# Patient Record
Sex: Female | Born: 1975 | Race: White | Hispanic: No | Marital: Single | Smoking: Current every day smoker
Health system: Southern US, Community
[De-identification: ages and names within clinical notes are randomized; demographics above are authoritative.]

## PROBLEM LIST (undated history)

## (undated) DIAGNOSIS — K8020 Calculus of gallbladder without cholecystitis without obstruction: Secondary | ICD-10-CM

## (undated) DIAGNOSIS — G4700 Insomnia, unspecified: Secondary | ICD-10-CM

## (undated) DIAGNOSIS — M5412 Radiculopathy, cervical region: Secondary | ICD-10-CM

## (undated) DIAGNOSIS — F32A Depression, unspecified: Secondary | ICD-10-CM

## (undated) DIAGNOSIS — M5136 Other intervertebral disc degeneration, lumbar region: Secondary | ICD-10-CM

## (undated) DIAGNOSIS — F329 Major depressive disorder, single episode, unspecified: Secondary | ICD-10-CM

## (undated) DIAGNOSIS — C539 Malignant neoplasm of cervix uteri, unspecified: Secondary | ICD-10-CM

## (undated) DIAGNOSIS — K219 Gastro-esophageal reflux disease without esophagitis: Secondary | ICD-10-CM

## (undated) DIAGNOSIS — O24419 Gestational diabetes mellitus in pregnancy, unspecified control: Secondary | ICD-10-CM

## (undated) HISTORY — PX: ANKLE SURGERY: SHX546

## (undated) HISTORY — PX: CERVICAL CONE BIOPSY: SUR198

---
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
## 2008-04-12 ENCOUNTER — Emergency Department (HOSPITAL_BASED_OUTPATIENT_CLINIC_OR_DEPARTMENT_OTHER): Admission: EM | Admit: 2008-04-12 | Discharge: 2008-04-12 | Payer: Self-pay | Admitting: Emergency Medicine

## 2008-08-01 ENCOUNTER — Emergency Department (HOSPITAL_COMMUNITY): Admission: EM | Admit: 2008-08-01 | Discharge: 2008-08-01 | Payer: Self-pay | Admitting: Emergency Medicine

## 2008-09-15 ENCOUNTER — Emergency Department (HOSPITAL_BASED_OUTPATIENT_CLINIC_OR_DEPARTMENT_OTHER): Admission: EM | Admit: 2008-09-15 | Discharge: 2008-09-15 | Payer: Self-pay | Admitting: Emergency Medicine

## 2009-07-29 ENCOUNTER — Emergency Department (HOSPITAL_BASED_OUTPATIENT_CLINIC_OR_DEPARTMENT_OTHER): Admission: EM | Admit: 2009-07-29 | Discharge: 2009-07-29 | Payer: Self-pay | Admitting: Emergency Medicine

## 2009-07-29 ENCOUNTER — Ambulatory Visit: Payer: Self-pay | Admitting: Diagnostic Radiology

## 2010-10-24 NOTE — L&D Delivery Note (Signed)
Delivery Note At 4:39 PM a viable female was delivered via Vaginal, Spontaneous Delivery (Presentation: ; Occiput Anterior).  APGAR: 9, 9; weight 6 lb 7.7 oz (2940 g).   Placenta status: Intact, Spontaneous.  Cord: 3 vessels with the following complications: None.   Anesthesia: Epidural  Episiotomy: None Lacerations: Perineal;Labial;2nd degree Suture Repair: 3.0 vicryl rapide Est. Blood Loss (mL): 400  Mom to postpartum.  Baby to nursery-stable.  [name],Deborah Therien 05/24/2011, 5:25 PM

## 2011-01-27 LAB — DIFFERENTIAL
Basophils Absolute: 0.1 10*3/uL (ref 0.0–0.1)
Basophils Relative: 1 % (ref 0–1)
Eosinophils Relative: 1 % (ref 0–5)
Lymphocytes Relative: 21 % (ref 12–46)
Neutro Abs: 7.8 10*3/uL — ABNORMAL HIGH (ref 1.7–7.7)
Neutrophils Relative %: 73 % (ref 43–77)

## 2011-01-27 LAB — COMPREHENSIVE METABOLIC PANEL
ALT: 18 U/L (ref 0–35)
Alkaline Phosphatase: 88 U/L (ref 39–117)
BUN: 9 mg/dL (ref 6–23)
Creatinine, Ser: 0.7 mg/dL (ref 0.4–1.2)
GFR calc Af Amer: >60 mL/min (ref >60)
GFR calc non Af Amer: >60 mL/min (ref >60)
Sodium: 141 mEq/L (ref 135–145)
Total Bilirubin: 0.7 mg/dL (ref 0.3–1.2)
Total Protein: 8 g/dL (ref 6.0–8.3)

## 2011-01-27 LAB — CBC
Hemoglobin: 15 g/dL (ref 12.0–15.0)
MCHC: 33 g/dL (ref 30.0–36.0)
MCV: 92.6 fL (ref 78.0–100.0)

## 2011-01-27 LAB — URINALYSIS, ROUTINE W REFLEX MICROSCOPIC
Bilirubin Urine: NEGATIVE
Ketones, ur: NEGATIVE mg/dL
Protein, ur: NEGATIVE mg/dL
Specific Gravity, Urine: 1.02 (ref 1.005–1.030)

## 2011-01-27 LAB — PREGNANCY, URINE: Preg Test, Ur: NEGATIVE

## 2011-02-04 ENCOUNTER — Inpatient Hospital Stay (HOSPITAL_COMMUNITY): Payer: Medicaid Other

## 2011-02-04 ENCOUNTER — Inpatient Hospital Stay (HOSPITAL_COMMUNITY)
Admission: AD | Admit: 2011-02-04 | Discharge: 2011-02-04 | Disposition: A | Discharge status: Home / Self Care | Payer: Medicaid Other | Source: Ambulatory Visit | Attending: Obstetrics and Gynecology | Admitting: Obstetrics and Gynecology

## 2011-02-04 DIAGNOSIS — O212 Late vomiting of pregnancy: Secondary | ICD-10-CM

## 2011-02-04 LAB — URINALYSIS, ROUTINE W REFLEX MICROSCOPIC
Bilirubin Urine: NEGATIVE
Nitrite: NEGATIVE
Protein, ur: NEGATIVE mg/dL
Specific Gravity, Urine: 1.02 (ref 1.005–1.030)
Urobilinogen, UA: 0.2 mg/dL (ref 0.0–1.0)
pH: >9 — ABNORMAL HIGH (ref 5.0–8.0)

## 2011-02-04 LAB — COMPREHENSIVE METABOLIC PANEL
AST: 16 U/L (ref 0–37)
Albumin: 3 g/dL — ABNORMAL LOW (ref 3.5–5.2)
Chloride: 104 mEq/L (ref 96–112)
Creatinine, Ser: 0.6 mg/dL (ref 0.4–1.2)
Total Protein: 6.4 g/dL (ref 6.0–8.3)

## 2011-02-04 LAB — AMYLASE: Amylase: 56 U/L (ref 0–105)

## 2011-02-04 LAB — CBC
MCHC: 32.9 g/dL (ref 30.0–36.0)
Platelets: 204 10*3/uL (ref 150–400)
RBC: 4.04 MIL/uL (ref 3.87–5.11)
RDW: 13.7 % (ref 11.5–15.5)
WBC: 16.9 10*3/uL — ABNORMAL HIGH (ref 4.0–10.5)

## 2011-05-18 ENCOUNTER — Other Ambulatory Visit: Payer: Self-pay | Admitting: Obstetrics and Gynecology

## 2011-05-23 ENCOUNTER — Encounter (HOSPITAL_COMMUNITY): Payer: Self-pay

## 2011-05-23 ENCOUNTER — Other Ambulatory Visit: Payer: Self-pay | Admitting: Obstetrics and Gynecology

## 2011-05-23 ENCOUNTER — Inpatient Hospital Stay (HOSPITAL_COMMUNITY)
Admission: RE | Admit: 2011-05-23 | Discharge: 2011-05-26 | DRG: 775 | Disposition: A | Discharge status: Home / Self Care | Payer: Medicaid Other | Source: Ambulatory Visit | Attending: Obstetrics and Gynecology | Admitting: Obstetrics and Gynecology

## 2011-05-23 DIAGNOSIS — Z3490 Encounter for supervision of normal pregnancy, unspecified, unspecified trimester: Secondary | ICD-10-CM

## 2011-05-23 DIAGNOSIS — O99814 Abnormal glucose complicating childbirth: Principal | ICD-10-CM | POA: Diagnosis present

## 2011-05-23 DIAGNOSIS — O24419 Gestational diabetes mellitus in pregnancy, unspecified control: Secondary | ICD-10-CM | POA: Diagnosis present

## 2011-05-23 HISTORY — DX: Major depressive disorder, single episode, unspecified: F32.9

## 2011-05-23 HISTORY — DX: Radiculopathy, cervical region: M54.12

## 2011-05-23 HISTORY — DX: Gastro-esophageal reflux disease without esophagitis: K21.9

## 2011-05-23 HISTORY — DX: Calculus of gallbladder without cholecystitis without obstruction: K80.20

## 2011-05-23 HISTORY — DX: Depression, unspecified: F32.A

## 2011-05-23 HISTORY — DX: Gestational diabetes mellitus in pregnancy, unspecified control: O24.419

## 2011-05-23 LAB — RPR: RPR: NONREACTIVE

## 2011-05-23 LAB — CBC
Hemoglobin: 11.9 g/dL — ABNORMAL LOW (ref 12.0–15.0)
MCH: 30.2 pg (ref 26.0–34.0)
MCHC: 33 g/dL (ref 30.0–36.0)
MCV: 91.6 fL (ref 78.0–100.0)

## 2011-05-23 LAB — STREP B DNA PROBE: GBS: POSITIVE

## 2011-05-23 LAB — HEPATITIS B SURFACE ANTIGEN: Hepatitis B Surface Ag: NEGATIVE

## 2011-05-23 MED ORDER — CITRIC ACID-SODIUM CITRATE 334-500 MG/5ML PO SOLN: 30.0000 mL | ORAL | Status: DC | PRN

## 2011-05-23 MED ORDER — OXYTOCIN 20 UNITS IN LACTATED RINGERS INFUSION - SIMPLE
125.0000 mL/h | Freq: Once | INTRAVENOUS | Status: AC
  Administered 2011-05-24: 500 mL/h via INTRAVENOUS

## 2011-05-23 MED ORDER — ZOLPIDEM TARTRATE 10 MG PO TABS
10.0000 mg | ORAL_TABLET | Freq: Every evening | ORAL | Status: DC | PRN
  Administered 2011-05-23: 10 mg via ORAL
  Filled 2011-05-23: qty 1

## 2011-05-23 MED ORDER — LACTATED RINGERS IV SOLN
INTRAVENOUS | Status: DC
  Administered 2011-05-24 (×2): via INTRAVENOUS

## 2011-05-23 MED ORDER — SODIUM CHLORIDE 0.9 % IJ SOLN
3.0000 mL | Freq: Two times a day (BID) | INTRAMUSCULAR | Status: AC
  Administered 2011-05-23 – 2011-05-24 (×3): 3 mL via INTRAVENOUS

## 2011-05-23 MED ORDER — OXYCODONE-ACETAMINOPHEN 5-325 MG PO TABS: 2.0000 | ORAL_TABLET | ORAL | Status: DC | PRN

## 2011-05-23 MED ORDER — ONDANSETRON HCL 4 MG/2ML IJ SOLN
4.0000 mg | Freq: Four times a day (QID) | INTRAMUSCULAR | Status: DC | PRN
  Administered 2011-05-24 (×2): 4 mg via INTRAVENOUS
  Filled 2011-05-23 (×2): qty 2

## 2011-05-23 MED ORDER — NALBUPHINE SYRINGE 5 MG/0.5 ML
10.0000 mg | INJECTION | INTRAMUSCULAR | Status: DC | PRN
  Administered 2011-05-24: 10 mg via INTRAVENOUS
  Filled 2011-05-23 (×2): qty 1

## 2011-05-23 MED ORDER — TERBUTALINE SULFATE 1 MG/ML IJ SOLN: 0.2500 mg | Freq: Once | INTRAMUSCULAR | Status: AC | PRN

## 2011-05-23 MED ORDER — LIDOCAINE HCL (PF) 1 % IJ SOLN
30.0000 mL | INTRAMUSCULAR | Status: DC | PRN
  Filled 2011-05-23: qty 30

## 2011-05-23 MED ORDER — ACETAMINOPHEN 325 MG PO TABS: 650.0000 mg | ORAL_TABLET | ORAL | Status: DC | PRN

## 2011-05-23 MED ORDER — TERBUTALINE SULFATE 1 MG/ML IJ SOLN: 0.2500 mg | Freq: Once | INTRAMUSCULAR | Status: DC | PRN

## 2011-05-23 MED ORDER — LACTATED RINGERS IV SOLN: 500.0000 mL | INTRAVENOUS | Status: DC | PRN

## 2011-05-23 MED ORDER — FLEET ENEMA 7-19 GM/118ML RE ENEM: 1.0000 | ENEMA | RECTAL | Status: DC | PRN

## 2011-05-23 MED ORDER — MISOPROSTOL 25 MCG QUARTER TABLET
25.0000 ug | ORAL_TABLET | ORAL | Status: DC | PRN
  Administered 2011-05-23 – 2011-05-24 (×2): 25 ug via VAGINAL
  Filled 2011-05-23 (×2): qty 0.25
  Filled 2011-05-23: qty 1

## 2011-05-23 NOTE — H&P (Signed)
Deborah [name] is an 35 y.o. female. G2P1001 at 39+ for iol secondary to GDM.  +FM, no LOF, no VB, occ ctx; Pregnancy complicated by DM, ? Pre-existing DM.  PMH - Cervical radiculopathy Depression Carpal Tunnel Syndrome  PSH: LEEP/Cone Ankle surgery Jaw surgery   Pertinent Gynecological History:  Sexually transmitted diseases: no past history Previous GYN Procedures: cone  pap:11/2010 WNL OB History: G2, P1001, TSVD 6#13    Family History CVA, DM.  Social History: denies alcohol, tobacco, or drug use; single  Allergies: NKDA/ No Latex   Review of Systems  Constitutional: Negative.   HENT: Negative.   Eyes: Negative.   Respiratory: Negative.   Cardiovascular: Negative.   Gastrointestinal: Negative.   Genitourinary: Negative.   Musculoskeletal: Positive for back pain.  Skin: Negative.   Neurological: Negative.   Endo/Heme/Allergies: Negative.   Psychiatric/Behavioral: Negative.    AFVSS Physical Exam  Constitutional: She is oriented to person, place, and time. She appears well-developed and well-nourished.  HENT:  Head: Normocephalic and atraumatic.  Eyes: Pupils are equal, round, and reactive to light.  Neck: Normal range of motion.  Cardiovascular: Normal rate and regular rhythm.   Respiratory: Effort normal and breath sounds normal.  GI: Soft.       Fundus NT  Neurological: She is alert and oriented to person, place, and time.    PNL: A-, Ab Scr neg, Hgb 12.9, Pap WNL, RI, HepBsAg -, HIV -, Plt 247K, GC neg, Chl neg, First Tri screen WNL, Rhogam 6/1, GBBS + [place] nl anat, ant plac, good growth, f/u with BW NST  Assessment/Plan: 35yo G2P1001 at 39+ with DM/GDM for iol Unfavorable cervix - treat with cervidil GBBS+ treat with PCN when pitocin started Expect SVD Follow FSBS  BOVARD,Lane Kjos 05/23/2011, 5:25 PM

## 2011-05-24 ENCOUNTER — Ambulatory Visit (HOSPITAL_COMMUNITY): Payer: Medicaid Other | Admitting: Anesthesiology

## 2011-05-24 ENCOUNTER — Encounter (HOSPITAL_COMMUNITY): Payer: Self-pay | Admitting: Anesthesiology

## 2011-05-24 ENCOUNTER — Encounter (HOSPITAL_COMMUNITY): Payer: Self-pay

## 2011-05-24 DIAGNOSIS — O24419 Gestational diabetes mellitus in pregnancy, unspecified control: Secondary | ICD-10-CM | POA: Diagnosis present

## 2011-05-24 DIAGNOSIS — Z3490 Encounter for supervision of normal pregnancy, unspecified, unspecified trimester: Secondary | ICD-10-CM

## 2011-05-24 LAB — RPR: RPR Ser Ql: NONREACTIVE

## 2011-05-24 LAB — GLUCOSE, CAPILLARY
Glucose-Capillary: 119 mg/dL — ABNORMAL HIGH (ref 70–99)
Glucose-Capillary: 120 mg/dL — ABNORMAL HIGH (ref 70–99)

## 2011-05-24 LAB — ABO/RH: ABO/RH(D): A NEG

## 2011-05-24 MED ORDER — BENZOCAINE-MENTHOL 20-0.5 % EX AERO
INHALATION_SPRAY | CUTANEOUS | Status: AC
  Filled 2011-05-24: qty 56

## 2011-05-24 MED ORDER — LACTATED RINGERS IV SOLN: INTRAVENOUS | Status: DC

## 2011-05-24 MED ORDER — PRENATAL PLUS 27-1 MG PO TABS
1.0000 | ORAL_TABLET | Freq: Every day | ORAL | Status: DC
  Administered 2011-05-24 – 2011-05-25 (×2): 1 via ORAL
  Filled 2011-05-24 (×2): qty 1

## 2011-05-24 MED ORDER — EPHEDRINE 5 MG/ML INJ
10.0000 mg | INTRAVENOUS | Status: DC | PRN
  Administered 2011-05-24: 10 mg via INTRAVENOUS
  Filled 2011-05-24: qty 4

## 2011-05-24 MED ORDER — LANOLIN HYDROUS EX OINT: TOPICAL_OINTMENT | CUTANEOUS | Status: DC | PRN

## 2011-05-24 MED ORDER — SODIUM CHLORIDE 0.9 % IV SOLN: INTRAVENOUS | Status: DC

## 2011-05-24 MED ORDER — SIMETHICONE 80 MG PO CHEW: 80.0000 mg | CHEWABLE_TABLET | ORAL | Status: DC | PRN

## 2011-05-24 MED ORDER — SODIUM CHLORIDE 0.45 % IV SOLN: INTRAVENOUS | Status: DC

## 2011-05-24 MED ORDER — COMPLETENATE 29-1 MG PO CHEW
1.0000 | CHEWABLE_TABLET | Freq: Every day | ORAL | Status: DC
  Administered 2011-05-26: 1 via ORAL
  Filled 2011-05-24 (×3): qty 1

## 2011-05-24 MED ORDER — DEXTROSE-NACL 5-0.45 % IV SOLN: INTRAVENOUS | Status: DC

## 2011-05-24 MED ORDER — ONDANSETRON HCL 4 MG/2ML IJ SOLN: 4.0000 mg | INTRAMUSCULAR | Status: DC | PRN

## 2011-05-24 MED ORDER — IBUPROFEN 600 MG PO TABS: 600.0000 mg | ORAL_TABLET | Freq: Four times a day (QID) | ORAL | Status: DC | PRN

## 2011-05-24 MED ORDER — ONDANSETRON HCL 4 MG PO TABS: 4.0000 mg | ORAL_TABLET | ORAL | Status: DC | PRN

## 2011-05-24 MED ORDER — RANITIDINE HCL 150 MG PO TABS: 150.0000 mg | ORAL_TABLET | Freq: Two times a day (BID) | ORAL | Status: DC

## 2011-05-24 MED ORDER — PHENYLEPHRINE 40 MCG/ML (10ML) SYRINGE FOR IV PUSH (FOR BLOOD PRESSURE SUPPORT)
80.0000 ug | PREFILLED_SYRINGE | INTRAVENOUS | Status: DC | PRN
  Filled 2011-05-24 (×2): qty 5

## 2011-05-24 MED ORDER — WITCH HAZEL-GLYCERIN EX PADS: MEDICATED_PAD | CUTANEOUS | Status: DC | PRN

## 2011-05-24 MED ORDER — PENICILLIN G POTASSIUM 5000000 UNITS IJ SOLR
5.0000 10*6.[IU] | Freq: Once | INTRAVENOUS | Status: AC
  Administered 2011-05-24: 5 10*6.[IU] via INTRAVENOUS
  Filled 2011-05-24: qty 5

## 2011-05-24 MED ORDER — PENICILLIN G POTASSIUM 5000000 UNITS IJ SOLR
2.5000 10*6.[IU] | INTRAVENOUS | Status: DC
  Administered 2011-05-24 (×2): 2.5 10*6.[IU] via INTRAVENOUS
  Filled 2011-05-24 (×5): qty 2.5

## 2011-05-24 MED ORDER — OXYTOCIN 20 UNITS IN LACTATED RINGERS INFUSION - SIMPLE
1.0000 m[IU]/min | INTRAVENOUS | Status: DC
  Administered 2011-05-24: 2 m[IU]/min via INTRAVENOUS
  Filled 2011-05-24: qty 1000

## 2011-05-24 MED ORDER — SENNOSIDES-DOCUSATE SODIUM 8.6-50 MG PO TABS
1.0000 | ORAL_TABLET | Freq: Every day | ORAL | Status: DC
  Administered 2011-05-24 – 2011-05-25 (×2): 1 via ORAL

## 2011-05-24 MED ORDER — ZOLPIDEM TARTRATE 5 MG PO TABS: 5.0000 mg | ORAL_TABLET | Freq: Every evening | ORAL | Status: DC | PRN

## 2011-05-24 MED ORDER — PHENYLEPHRINE 40 MCG/ML (10ML) SYRINGE FOR IV PUSH (FOR BLOOD PRESSURE SUPPORT)
80.0000 ug | PREFILLED_SYRINGE | INTRAVENOUS | Status: DC | PRN
  Filled 2011-05-24: qty 5

## 2011-05-24 MED ORDER — DEXTROSE 50 % IV SOLN: 25.0000 mL | INTRAVENOUS | Status: DC | PRN

## 2011-05-24 MED ORDER — TERBUTALINE SULFATE 1 MG/ML IJ SOLN: 0.2500 mg | Freq: Once | INTRAMUSCULAR | Status: DC | PRN

## 2011-05-24 MED ORDER — IBUPROFEN 600 MG PO TABS
600.0000 mg | ORAL_TABLET | Freq: Four times a day (QID) | ORAL | Status: DC
  Administered 2011-05-25 – 2011-05-26 (×6): 600 mg via ORAL
  Filled 2011-05-24 (×6): qty 1

## 2011-05-24 MED ORDER — ONDANSETRON HCL 4 MG PO TABS: 4.0000 mg | ORAL_TABLET | Freq: Three times a day (TID) | ORAL | Status: DC | PRN

## 2011-05-24 MED ORDER — FAMOTIDINE 20 MG PO TABS
20.0000 mg | ORAL_TABLET | Freq: Two times a day (BID) | ORAL | Status: DC
  Administered 2011-05-24 – 2011-05-26 (×5): 20 mg via ORAL
  Filled 2011-05-24 (×5): qty 1

## 2011-05-24 MED ORDER — LACTATED RINGERS IV SOLN
500.0000 mL | Freq: Once | INTRAVENOUS | Status: AC
  Administered 2011-05-24: 500 mL via INTRAVENOUS

## 2011-05-24 MED ORDER — DIPHENHYDRAMINE HCL 50 MG/ML IJ SOLN: 12.5000 mg | INTRAMUSCULAR | Status: DC | PRN

## 2011-05-24 MED ORDER — EPHEDRINE 5 MG/ML INJ
10.0000 mg | INTRAVENOUS | Status: DC | PRN
  Administered 2011-05-24: 10 mg via INTRAVENOUS
  Filled 2011-05-24 (×2): qty 4

## 2011-05-24 MED ORDER — BENZOCAINE-MENTHOL 20-0.5 % EX AERO: 1.0000 "application " | INHALATION_SPRAY | CUTANEOUS | Status: DC | PRN

## 2011-05-24 MED ORDER — FENTANYL 2.5 MCG/ML BUPIVACAINE 1/10 % EPIDURAL INFUSION (WH - ANES)
14.0000 mL/h | INTRAMUSCULAR | Status: DC
  Administered 2011-05-24 (×3): 14 mL/h via EPIDURAL
  Filled 2011-05-24 (×2): qty 60

## 2011-05-24 MED ORDER — TETANUS-DIPHTH-ACELL PERTUSSIS 5-2.5-18.5 LF-MCG/0.5 IM SUSP
0.5000 mL | Freq: Once | INTRAMUSCULAR | Status: AC
  Administered 2011-05-25: 0.5 mL via INTRAMUSCULAR

## 2011-05-24 MED ORDER — SODIUM CHLORIDE 0.9 % IJ SOLN: 3.0000 mL | INTRAMUSCULAR | Status: DC | PRN

## 2011-05-24 MED ORDER — SODIUM CHLORIDE 0.9 % IJ SOLN
3.0000 mL | Freq: Two times a day (BID) | INTRAMUSCULAR | Status: DC
Start: 2011-05-24 — End: 2011-05-26

## 2011-05-24 MED ORDER — OXYTOCIN 20 UNITS IN LACTATED RINGERS INFUSION - SIMPLE: 125.0000 mL/h | INTRAVENOUS | Status: DC | PRN

## 2011-05-24 MED ORDER — OXYCODONE-ACETAMINOPHEN 5-325 MG PO TABS
1.0000 | ORAL_TABLET | ORAL | Status: DC | PRN
  Administered 2011-05-24 – 2011-05-25 (×2): 2 via ORAL
  Administered 2011-05-25: 1 via ORAL
  Administered 2011-05-25: 2 via ORAL
  Administered 2011-05-25: 1 via ORAL
  Administered 2011-05-25 – 2011-05-26 (×3): 2 via ORAL
  Filled 2011-05-24 (×2): qty 1
  Filled 2011-05-24 (×6): qty 2

## 2011-05-24 MED ORDER — DIPHENHYDRAMINE HCL 25 MG PO CAPS: 25.0000 mg | ORAL_CAPSULE | Freq: Four times a day (QID) | ORAL | Status: DC | PRN

## 2011-05-24 MED ORDER — SODIUM CHLORIDE 0.9 % IV SOLN: 250.0000 mL | INTRAVENOUS | Status: DC

## 2011-05-24 MED ORDER — LORATADINE 10 MG PO TABS
10.0000 mg | ORAL_TABLET | Freq: Every day | ORAL | Status: DC
  Administered 2011-05-26: 10 mg via ORAL
  Filled 2011-05-24 (×4): qty 1

## 2011-05-24 MED ORDER — ALBUTEROL SULFATE HFA 108 (90 BASE) MCG/ACT IN AERS
1.0000 | INHALATION_SPRAY | Freq: Four times a day (QID) | RESPIRATORY_TRACT | Status: DC | PRN
  Filled 2011-05-24: qty 6.7

## 2011-05-24 NOTE — Progress Notes (Signed)
Deborah [name] is a 35 y.o. G2P1001 at [redacted]w[redacted]d admitted for induction of labor due to Gestational diabetes.  Subjective: No c/o's, received cytotec overnight, uncomfortable with ctx o/w doing well  Objective: BP 91/48  Pulse 72  Temp(Src) 97.9 F (36.6 C) (Oral)  Resp 18     FSBS = 119 gen NAD FHT:  FHR: 120-140 bpm, variability: moderate,  accelerations:  Present,  decelerations:  Absent UC:   regular, every 2-5 minutes SVE:   Dilation: 1.5  Effacement (%): 50 Station: -2 Exam by:: Bovard, MD  AROM for clear fluid w/o diff/comp  Labs: Lab Results  Component Value Date   WBC 11.0* 05/23/2011   HGB 11.9* 05/23/2011   HCT 36.1 05/23/2011   MCV 91.6 05/23/2011   PLT 197 05/23/2011    Assessment / Plan: Induction of labor due to gestational diabetes,  progressing well on pitocin  Labor: received cytotec o/n; will start pitocin Preeclampsia:  no signs or symptoms of toxicity Fetal Wellbeing:  Category II Pain Control:  Epidural and IV pain medicine at request Anticipated MOD:  NSVD Glucomander prn  BOVARD,Cassy Sprowl 05/24/2011, 7:33 AM

## 2011-05-24 NOTE — Anesthesia Procedure Notes (Signed)
Epidural Patient location during procedure: OB Start time: 05/24/2011 9:50 AM  Staffing Anesthesiologist: Brayton Caves R Performed by: anesthesiologist   Preanesthetic Checklist Completed: patient identified, site marked, surgical consent, pre-op evaluation, timeout performed, IV checked, risks and benefits discussed and monitors and equipment checked  Epidural Patient position: sitting Prep: site prepped and draped and DuraPrep Patient monitoring: continuous pulse ox and blood pressure Approach: midline Injection technique: LOR saline  Needle:  Needle type: Tuohy  Needle gauge: 17 G Needle length: 9 cm Needle insertion depth: 5 cm cm Catheter type: closed end flexible Catheter size: 19 Gauge Catheter at skin depth: 10 cm Test dose: negative  Assessment Events: blood not aspirated, injection not painful, no injection resistance, negative IV test and no paresthesia  Additional Notes Patient identified.  Risk benefits discussed including failed block, incomplete pain control, headache, nerve damage, paralysis, blood pressure changes, nausea, vomiting, reactions to medication both toxic or allergic, and postpartum back pain.  Patient expressed understanding and wished to proceed.  All questions were answered.  Sterile technique used throughout procedure and epidural site dressed with sterile barrier dressing. No paresthesia or other complications noted. 5 minutes prior to starting epidural infusion of fentanyl and bupivicaine, the epidural was loaded with 9 ml of 1.5% lidocaine in three separate doses.  The patient did not experience any signs of intravascular injection such as tinnitus or metallic taste in mouth nor signs of intrathecal spread such as rapid motor block. Please see nursing notes for vital signs.

## 2011-05-24 NOTE — Progress Notes (Signed)
Deborah [name] is a 35 y.o. G2P1001 at [redacted]w[redacted]d admitted for induction of labor due to Gestational diabetes.  Subjective: No c/o's, comfortable with epidural  Objective: BP 115/79  Pulse 86  Temp(Src) 97.5 F (36.4 C) (Oral)  Resp 18  Ht 5\' 2"  (1.575 m)  Wt 80.74 kg (178 lb)  BMI 32.56 kg/m2  SpO2 99%     gen NAD FHT:  FHR: 120's bpm, variability: moderate,  accelerations:  Present,  decelerations:  Absent UC:   regular, every 2-4 minutes SVE:   Dilation: 3 Effacement (%): 90 Station: -2 Exam by:: Bovard, MD  Labs: Lab Results  Component Value Date   WBC 11.0* 05/23/2011   HGB 11.9* 05/23/2011   HCT 36.1 05/23/2011   MCV 91.6 05/23/2011   PLT 197 05/23/2011    Assessment / Plan: Induction of labor due to gestational diabetes,  progressing well on pitocin  Labor: Progressing on Pitocin.  D/w pt latent phase of labor, pitocin at 4mU Preeclampsia:  no signs or symptoms of toxicity Fetal Wellbeing:  Category II Pain Control:  Epidural  Anticipated MOD:  NSVD  BOVARD,Seraphina Mitchner 05/24/2011, 12:38 PM

## 2011-05-24 NOTE — Anesthesia Preprocedure Evaluation (Signed)
Anesthesia Evaluation  Name, MR# and DOB Patient awake  General Assessment Comment  Reviewed: Allergy & Precautions, H&P  and Patient's Chart, lab work & pertinent test results  Airway Mallampati: II TM Distance: >3 FB Neck ROM: full    Dental No notable dental hx    Pulmonaryneg pulmonary ROS    clear to auscultation  pulmonary exam normal   Cardiovascular regular Normal   Neuro/Psych (+) {AN ROS/MED HX NEURO HEADACHES (+) PSYCHIATRIC DISORDERS, Bulging discs  Neuromuscular diseaseNegative Neurological ROS Negative Psych ROS  GI/Hepatic/Renal negative GI ROS, negative Liver ROS, and negative Renal ROS (+)  GERD      Endo/Other  Negative Endocrine ROS (+) Diabetes mellitus-, Well Controlled  Abdominal   Musculoskeletal  Hematology negative hematology ROS (+)   Peds  Reproductive/Obstetrics (+) Pregnancy   Anesthesia Other Findings             Anesthesia Physical Anesthesia Plan  ASA: III  Anesthesia Plan: Epidural   Post-op Pain Management:    Induction:   Airway Management Planned:   Additional Equipment:   Intra-op Plan:   Post-operative Plan:   Informed Consent: I have reviewed the patients History and Physical, chart, labs and discussed the procedure including the risks, benefits and alternatives for the proposed anesthesia with the patient or authorized representative who has indicated his/her understanding and acceptance.     Plan Discussed with:   Anesthesia Plan Comments:         Anesthesia Quick Evaluation

## 2011-05-25 LAB — CBC
HCT: 30.9 % — ABNORMAL LOW (ref 36.0–46.0)
Hemoglobin: 10.1 g/dL — ABNORMAL LOW (ref 12.0–15.0)
MCH: 29.7 pg (ref 26.0–34.0)
MCV: 90.9 fL (ref 78.0–100.0)
RBC: 3.4 MIL/uL — ABNORMAL LOW (ref 3.87–5.11)

## 2011-05-25 MED ORDER — RHO D IMMUNE GLOBULIN 1500 UNIT/2ML IJ SOLN
300.0000 ug | Freq: Once | INTRAMUSCULAR | Status: AC
  Administered 2011-05-25: 300 ug via INTRAMUSCULAR
  Filled 2011-05-25: qty 2

## 2011-05-25 NOTE — Progress Notes (Signed)
UR chart review completed.  

## 2011-05-25 NOTE — Progress Notes (Signed)
Post Partum Day 1 Subjective: no complaints, voiding, tolerating PO and nl lochia, pain controlled  Objective: Blood pressure 102/70, pulse 81, temperature 97.7 F (36.5 C), temperature source Oral, resp. rate 18, height 5\' 2"  (1.575 m), weight 81.194 kg (179 lb), SpO2 99.00%, unknown if currently breastfeeding.  Physical Exam:  General: alert and no distress Lochia: appropriate Uterine Fundus: firm   Basename 05/23/11 2050  HGB 11.9*  HCT 36.1    Assessment/Plan: Plan for discharge tomorrow.  Circumcision in office.    LOS: 2 days   BOVARD,Camauri Fleece 05/25/2011, 8:26 AM

## 2011-05-25 NOTE — Progress Notes (Addendum)
Notified Dr Jean Rosenthal, anesthesia, that MS Melfi states R upper anterior thigh has 4 in x 4 in numb region. Pt ambulates without any problems. Dr. Jean Rosenthal states "It is normal", no further orders received.

## 2011-05-25 NOTE — Anesthesia Postprocedure Evaluation (Signed)
  Anesthesia Post-op Note  Patient: Deborah [name]  Procedure(s) Performed: * No procedures listed *  Patient Location: PACU and Mother/Baby  Anesthesia Type: Epidural  Level of Consciousness: awake, alert , oriented and patient cooperative  Airway and Oxygen Therapy: Patient Spontanous Breathing  Post-op Pain: none  Post-op Assessment: Post-op Vital signs reviewed, Patient's Cardiovascular Status Stable and Respiratory Function Stable  Post-op Vital Signs: Reviewed and stable  Complications: No apparent anesthesia complications

## 2011-05-26 LAB — RH IG WORKUP (INCLUDES ABO/RH)
Fetal Screen: NEGATIVE
Unit division: 0

## 2011-05-26 MED ORDER — OXYCODONE-ACETAMINOPHEN 5-325 MG PO TABS: 1.0000 | ORAL_TABLET | ORAL | Status: AC | PRN

## 2011-05-26 MED ORDER — COMPLETENATE 29-1 MG PO CHEW: 1.0000 | CHEWABLE_TABLET | Freq: Every day | ORAL | Status: AC

## 2011-05-26 MED ORDER — IBUPROFEN 600 MG PO TABS: 600.0000 mg | ORAL_TABLET | Freq: Four times a day (QID) | ORAL | Status: AC

## 2011-05-26 NOTE — Discharge Summary (Signed)
Obstetric Discharge Summary Reason for Admission: induction of labor Prenatal Procedures: none Intrapartum Procedures: spontaneous vaginal delivery, GBBS prophylaxis, monitoring CBG Postpartum Procedures: Rho(D) Ig Complications-Operative and Postpartum: 2nd degree perineal laceration and vaginal laceration Hemoglobin  Date Value Range Status  05/25/2011 10.1* 12.0-15.0 (g/dL) Final     HCT  Date Value Range Status  05/25/2011 30.9* 36.0-46.0 (%) Final    Discharge Diagnoses: Term Pregnancy-delivered and gestational diabetes  Discharge Information: Date: 05/26/2011 Activity: pelvic rest Diet: routine Medications: PNV, Ibuprophen and Percocet Condition: stable Instructions: refer to practice specific booklet Discharge to: home Follow-up Information    Follow up with BOVARD,Lora Chavers, MD. Make an appointment in 6 weeks. (Call with questions or problems)    Contact information:   [address] [place] [address] [phone]          Newborn Data: Live born female  Birth Weight: 6 lb 7.7 oz (2940 g) APGAR: 9, 9  Home with mother.  IUD PP, also schedule 2hr GTT  BOVARD,Taytem Ghattas 05/26/2011, 8:33 AM

## 2011-05-26 NOTE — Progress Notes (Signed)
Post Partum Day 2 Subjective: no complaints, up ad lib, voiding, tolerating PO and nl lochia, pain controlled  Objective: Blood pressure 90/61, pulse 71, temperature 98.2 F (36.8 C), temperature source Oral, resp. rate 18, height 5\' 2"  (1.575 m), weight 81.194 kg (179 lb), SpO2 99.00%, unknown if currently breastfeeding.  Physical Exam:  General: alert and no distress Lochia: appropriate Uterine Fundus: firm    Basename 05/25/11 0902 05/23/11 2050  HGB 10.1* 11.9*  HCT 30.9* 36.1    Assessment/Plan: Discharge home  Doing well, routine care.  D/c with Motrin, Vicodin and PNV, f/u in 6 weeks and for baby's circumcision   LOS: 3 days   [name],Deborah Monical 05/26/2011, 8:24 AM

## 2011-06-04 ENCOUNTER — Encounter (HOSPITAL_BASED_OUTPATIENT_CLINIC_OR_DEPARTMENT_OTHER): Payer: Self-pay | Admitting: Emergency Medicine

## 2011-06-04 ENCOUNTER — Emergency Department (HOSPITAL_BASED_OUTPATIENT_CLINIC_OR_DEPARTMENT_OTHER)
Admission: EM | Admit: 2011-06-04 | Discharge: 2011-06-04 | Disposition: A | Discharge status: Home / Self Care | Payer: Medicaid Other | Attending: Emergency Medicine | Admitting: Emergency Medicine

## 2011-06-04 DIAGNOSIS — IMO0001 Reserved for inherently not codable concepts without codable children: Secondary | ICD-10-CM | POA: Insufficient documentation

## 2011-06-04 DIAGNOSIS — M549 Dorsalgia, unspecified: Secondary | ICD-10-CM

## 2011-06-04 DIAGNOSIS — K219 Gastro-esophageal reflux disease without esophagitis: Secondary | ICD-10-CM | POA: Insufficient documentation

## 2011-06-04 DIAGNOSIS — M5126 Other intervertebral disc displacement, lumbar region: Secondary | ICD-10-CM | POA: Insufficient documentation

## 2011-06-04 DIAGNOSIS — M5416 Radiculopathy, lumbar region: Secondary | ICD-10-CM

## 2011-06-04 DIAGNOSIS — IMO0002 Reserved for concepts with insufficient information to code with codable children: Secondary | ICD-10-CM | POA: Insufficient documentation

## 2011-06-04 MED ORDER — OXYCODONE-ACETAMINOPHEN 5-325 MG PO TABS: 2.0000 | ORAL_TABLET | ORAL | Status: AC | PRN

## 2011-06-04 MED ORDER — IBUPROFEN 800 MG PO TABS: 800.0000 mg | ORAL_TABLET | Freq: Three times a day (TID) | ORAL | Status: AC | PRN

## 2011-06-04 MED ORDER — DIAZEPAM 5 MG PO TABS: 5.0000 mg | ORAL_TABLET | Freq: Four times a day (QID) | ORAL | Status: AC | PRN

## 2011-06-04 NOTE — ED Notes (Signed)
Pt reports onset of lower back pain in R glute and hip SP epidural analgesia
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
## 2011-07-21 LAB — PREGNANCY, URINE: Preg Test, Ur: NEGATIVE

## 2011-07-21 LAB — URINALYSIS, ROUTINE W REFLEX MICROSCOPIC
Glucose, UA: NEGATIVE
Hgb urine dipstick: NEGATIVE
Ketones, ur: NEGATIVE
Protein, ur: NEGATIVE
pH: 7.5

## 2011-07-21 LAB — BASIC METABOLIC PANEL
BUN: 10
CO2: 27
Chloride: 106
Creatinine, Ser: 0.7

## 2011-07-26 LAB — CBC
MCV: 89
Platelets: 221
RBC: 5.1
WBC: 15.1 — ABNORMAL HIGH

## 2011-07-26 LAB — URINALYSIS, ROUTINE W REFLEX MICROSCOPIC
Hgb urine dipstick: NEGATIVE
Specific Gravity, Urine: 1.024
Urobilinogen, UA: 0.2

## 2011-07-26 LAB — COMPREHENSIVE METABOLIC PANEL
ALT: 24
AST: 22
Albumin: 4.7
Alkaline Phosphatase: 104
CO2: 26
Chloride: 104
Creatinine, Ser: 0.7
GFR calc Af Amer: >60
GFR calc non Af Amer: >60
Potassium: 3.7
Sodium: 141
Total Bilirubin: 0.9

## 2011-07-26 LAB — DIFFERENTIAL
Basophils Absolute: 0.1
Eosinophils Absolute: 0.2
Eosinophils Relative: 1
Monocytes Absolute: 0.7

## 2011-09-30 IMAGING — US US ABDOMEN COMPLETE
1 series · 13 of 25 positions shown · non-contrast
Comparison: 07/29/2009

CLINICAL DATA: 24 weeks estimated gestational age with nausea
vomiting and back pain.

COMPLETE ABDOMINAL ULTRASOUND

[Series 1: us abdomen complete · 13 of 72 slices shown]
[im 1/72]
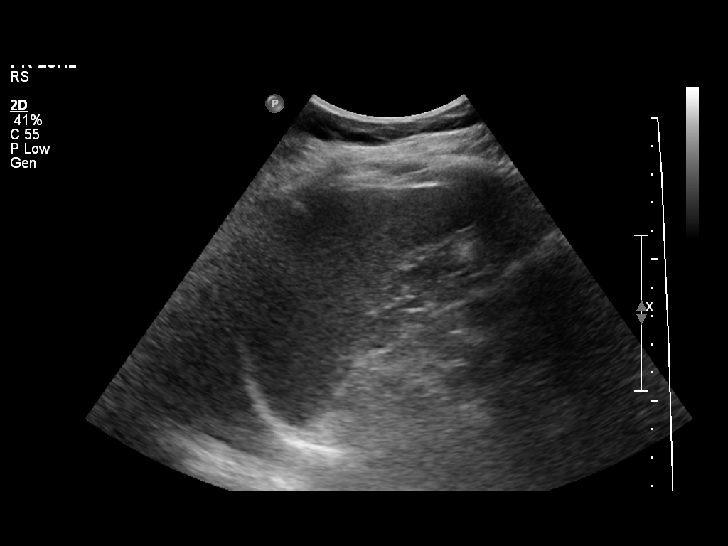
[im 6/72]
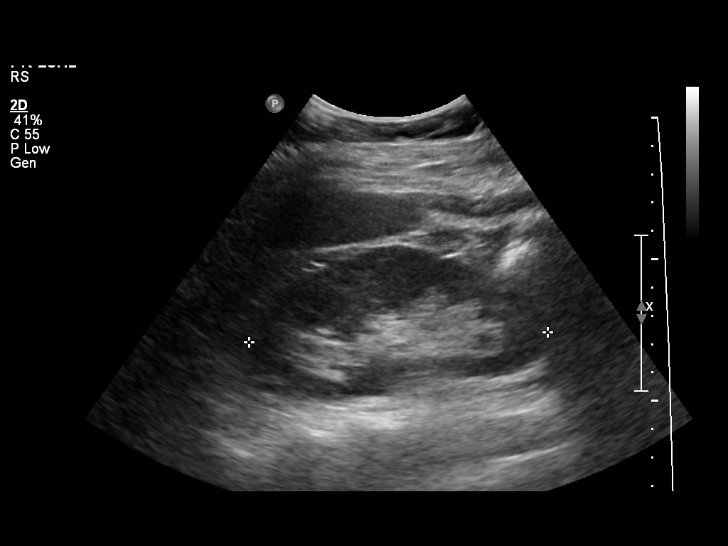
[im 12/72]
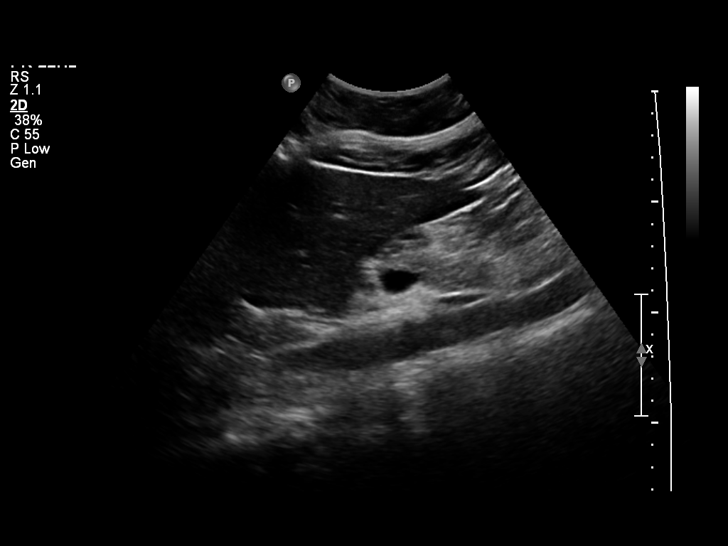
[im 18/72]
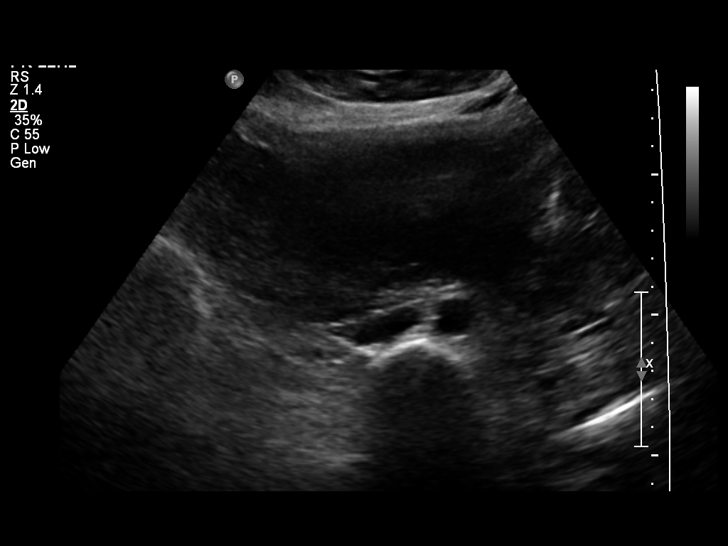
[im 24/72]
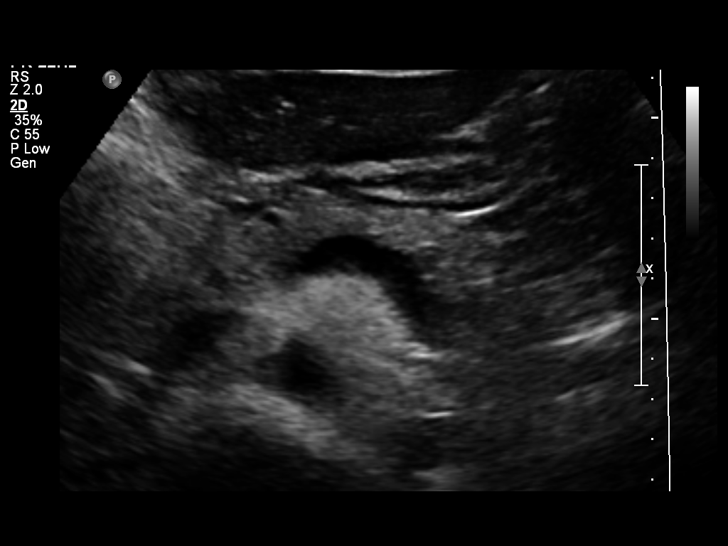
[im 30/72]
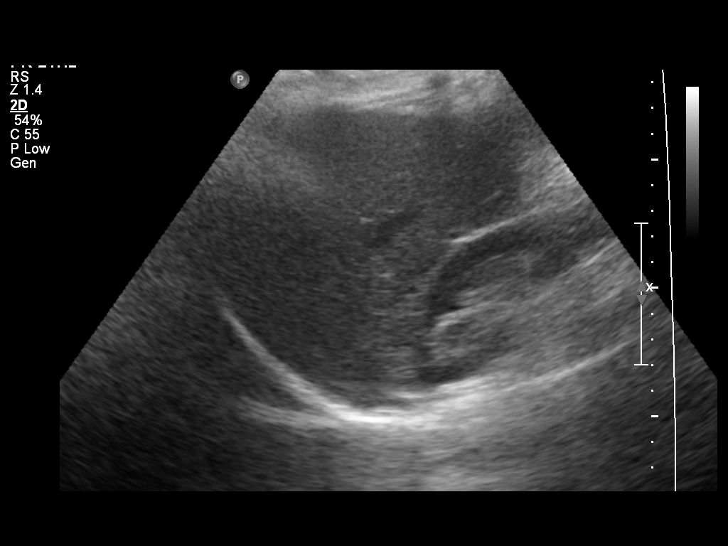
[im 36/72]
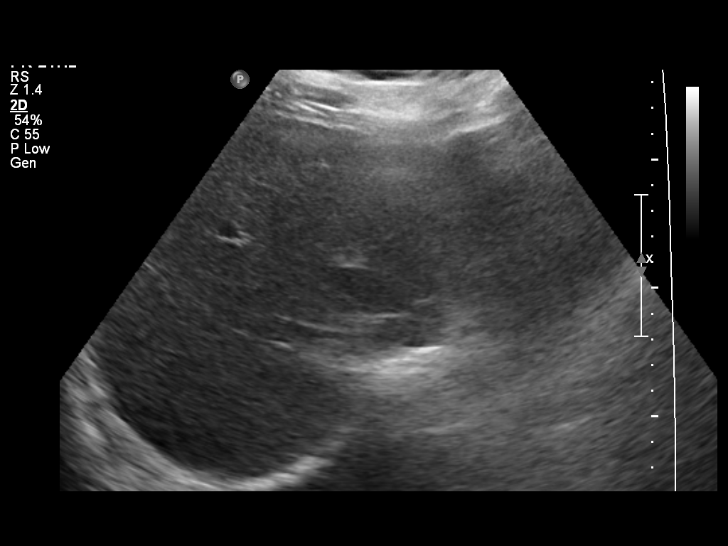
[im 42/72]
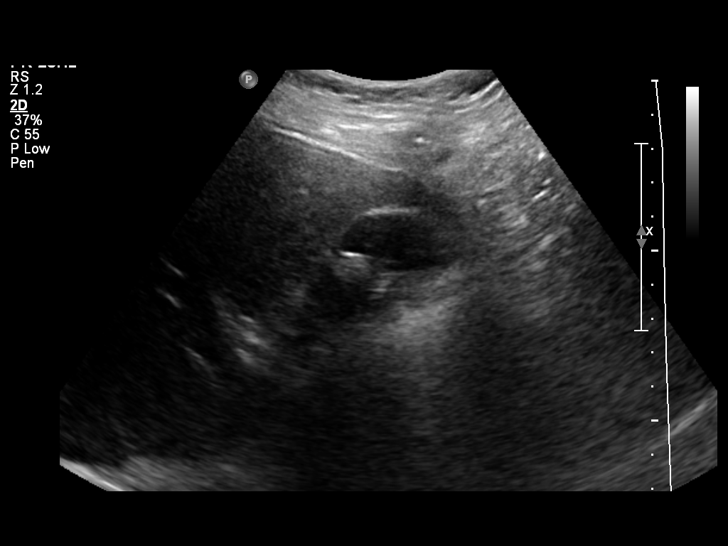
[im 48/72]
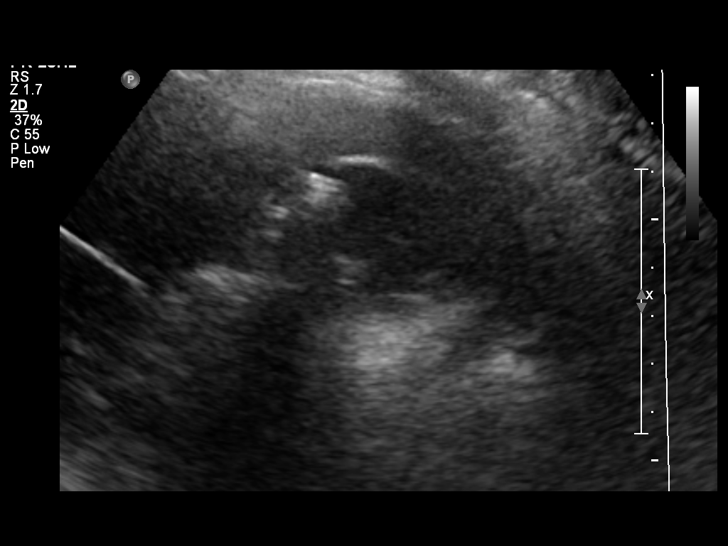
[im 54/72]
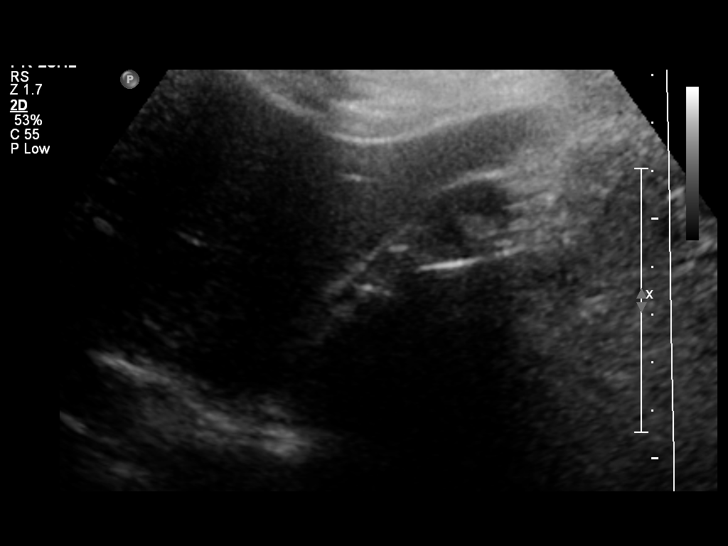
[im 60/72]
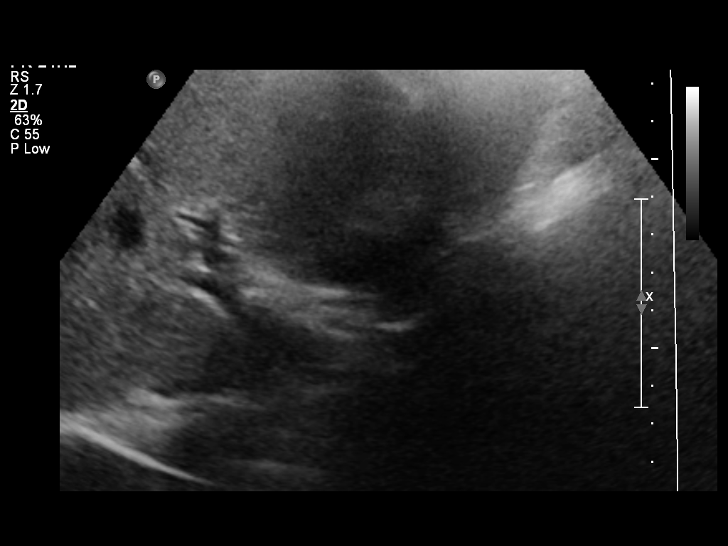
[im 66/72]
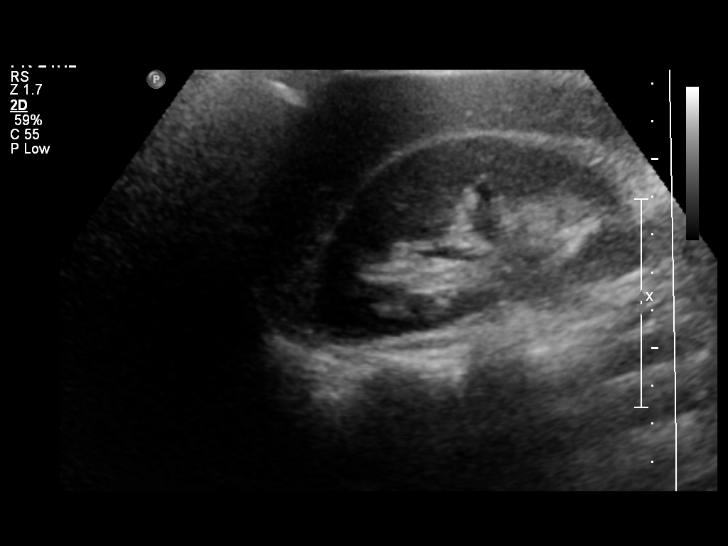
[im 72/72]
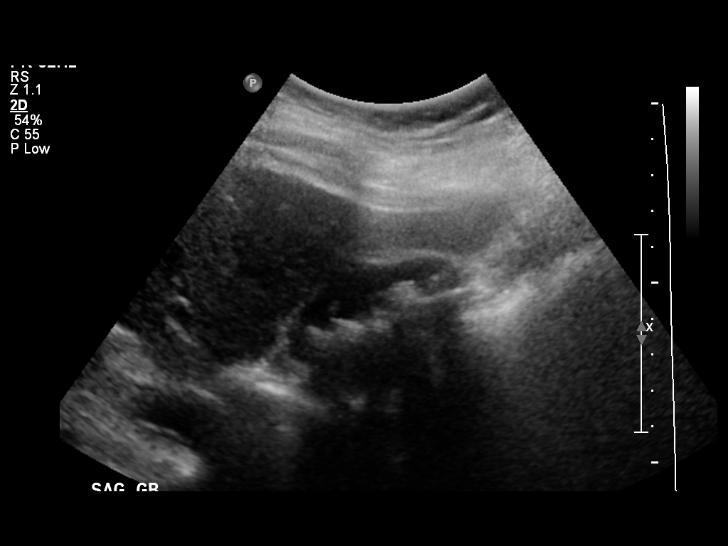

[13 of 25 positions shown; findings below may reference images not displayed]

FINDINGS: Gallbladder:  Multiple mobile gallstones are again identified with
the largest independently measured gallstone measuring 12.6 mm.  No
gallbladder wall thickening or pericholecystic fluid is noted but
the patient did exhibit a positive sonographic Murphy's sign
according to the performing sonographer.

Common bile duct:  Has a diameter 3.2 mm and normal appearance

Liver:  No focal lesion identified.  Within normal limits in
parenchymal echogenicity.

IVC:  Appears normal.

Pancreas:  No focal abnormality seen.

Spleen:  Has a sagittal length 8.2 cm.  No focal parenchymal
abnormality is noted

Right Kidney:  Has a sagittal length of 8.9 cm.  No focal
parenchymal abnormality is seen.  Minimal pyelectasis is noted
compatible the patient's current gestational age.

Left Kidney:  Has a sagittal length of 10.6 cm.  No focal
parenchymal abnormality or signs of hydronephrosis are noted.

Abdominal aorta:  Has a maximal caliber of 2.2 cm with no
aneurysmal dilatation seen
IMPRESSION: Cholelithiasis with a positive Murphy's sign.  No other sonographic
features suggestive of acute cholecystitis.

Otherwise normal gravid abdominal ultrasound.

## 2013-02-28 ENCOUNTER — Encounter (HOSPITAL_BASED_OUTPATIENT_CLINIC_OR_DEPARTMENT_OTHER): Payer: Self-pay | Admitting: *Deleted

## 2013-02-28 ENCOUNTER — Emergency Department (HOSPITAL_BASED_OUTPATIENT_CLINIC_OR_DEPARTMENT_OTHER)
Admission: EM | Admit: 2013-02-28 | Discharge: 2013-02-28 | Disposition: A | Discharge status: Home / Self Care | Payer: Medicaid Other | Attending: Emergency Medicine | Admitting: Emergency Medicine

## 2013-02-28 DIAGNOSIS — F329 Major depressive disorder, single episode, unspecified: Secondary | ICD-10-CM | POA: Insufficient documentation

## 2013-02-28 DIAGNOSIS — R5381 Other malaise: Secondary | ICD-10-CM | POA: Insufficient documentation

## 2013-02-28 DIAGNOSIS — G629 Polyneuropathy, unspecified: Secondary | ICD-10-CM

## 2013-02-28 DIAGNOSIS — R209 Unspecified disturbances of skin sensation: Secondary | ICD-10-CM | POA: Insufficient documentation

## 2013-02-28 DIAGNOSIS — F17200 Nicotine dependence, unspecified, uncomplicated: Secondary | ICD-10-CM | POA: Insufficient documentation

## 2013-02-28 DIAGNOSIS — K219 Gastro-esophageal reflux disease without esophagitis: Secondary | ICD-10-CM | POA: Insufficient documentation

## 2013-02-28 DIAGNOSIS — Z8719 Personal history of other diseases of the digestive system: Secondary | ICD-10-CM | POA: Insufficient documentation

## 2013-02-28 DIAGNOSIS — R5383 Other fatigue: Secondary | ICD-10-CM | POA: Insufficient documentation

## 2013-02-28 DIAGNOSIS — Z79899 Other long term (current) drug therapy: Secondary | ICD-10-CM | POA: Insufficient documentation

## 2013-02-28 DIAGNOSIS — G589 Mononeuropathy, unspecified: Secondary | ICD-10-CM | POA: Insufficient documentation

## 2013-02-28 DIAGNOSIS — F3289 Other specified depressive episodes: Secondary | ICD-10-CM | POA: Insufficient documentation

## 2013-02-28 DIAGNOSIS — Z8541 Personal history of malignant neoplasm of cervix uteri: Secondary | ICD-10-CM | POA: Insufficient documentation

## 2013-02-28 DIAGNOSIS — Z8739 Personal history of other diseases of the musculoskeletal system and connective tissue: Secondary | ICD-10-CM | POA: Insufficient documentation

## 2013-02-28 HISTORY — DX: Malignant neoplasm of cervix uteri, unspecified: C53.9

## 2013-02-28 LAB — CBC WITH DIFFERENTIAL/PLATELET
Basophils Absolute: 0.1 10*3/uL (ref 0.0–0.1)
Basophils Relative: 1 % (ref 0–1)
Eosinophils Absolute: 0.3 10*3/uL (ref 0.0–0.7)
Eosinophils Relative: 4 % (ref 0–5)
HCT: 38.2 % (ref 36.0–46.0)
MCHC: 34 g/dL (ref 30.0–36.0)
MCV: 92 fL (ref 78.0–100.0)
Monocytes Absolute: 0.6 10*3/uL (ref 0.1–1.0)
Platelets: 202 10*3/uL (ref 150–400)
RDW: 13 % (ref 11.5–15.5)
WBC: 7.1 10*3/uL (ref 4.0–10.5)

## 2013-02-28 LAB — BASIC METABOLIC PANEL
Calcium: 8.5 mg/dL (ref 8.4–10.5)
Creatinine, Ser: 0.6 mg/dL (ref 0.50–1.10)
GFR calc Af Amer: >90 mL/min (ref >90)
GFR calc non Af Amer: >90 mL/min (ref >90)
Sodium: 140 mEq/L (ref 135–145)

## 2013-02-28 MED ORDER — IBUPROFEN 600 MG PO TABS: 600.0000 mg | ORAL_TABLET | Freq: Four times a day (QID) | ORAL | Status: AC | PRN

## 2013-02-28 MED ORDER — HYDROCODONE-ACETAMINOPHEN 5-325 MG PO TABS: 2.0000 | ORAL_TABLET | ORAL | Status: AC | PRN

## 2013-02-28 NOTE — ED Notes (Signed)
C/o burning numbness and pain in both hands x 2 months- worse for last 4 days- denies known injury

## 2013-02-28 NOTE — ED Provider Notes (Signed)
History     CSN: [id number]  Arrival date & time 02/28/13  1804   First MD Initiated Contact with Patient 02/28/13 1820      Chief Complaint  Patient presents with  . Hand Pain    (Consider location/radiation/quality/duration/timing/severity/associated sxs/prior treatment) HPI Comments: Patient presents with burning to both hands. She states her symptoms started 2 months ago. She states initially after doing yard work she weakness burning pain in both of her hands that would subside the next day. She states over the last month it's progressively gotten worse where she wakes up with constant pain to both her hands. She describes as a burning pain it starts in her wrists and radiates through both hands. It involves all 5 fingers of both hands. She has some tingling in the tips of her fingers but denies any other numbness or hands. She denies any injuries. She states it's worse with movement of the wrist. Denies any past injuries to her hands. She states it's progressively gotten worse over last couple months it is not related to her hands being cold.  Patient is a 37 y.o. female presenting with hand pain.  Hand Pain Pertinent negatives include no chest pain, no abdominal pain, no headaches and no shortness of breath.    Past Medical History  Diagnosis Date  . Gestational diabetes   . Depression   . GERD (gastroesophageal reflux disease)   . Gallstones   . Cervical radiculopathy   . SVD (spontaneous vaginal delivery) 05/24/2011  . Cervical cancer     Past Surgical History  Procedure Laterality Date  . Mandible fracture surgery  1992  . Ankle surgery    . Cervical cone biopsy      Family History  Problem Relation Age of Onset  . Cancer Mother   . Diabetes Father   . Hypertension Father     History  Substance Use Topics  . Smoking status: Current Every Day Smoker -- 0.25 packs/day    Types: Cigarettes  . Smokeless tobacco: Never Used  . Alcohol Use: No    OB History    Grav Para Term Preterm Abortions TAB SAB Ect Mult Living   3 2 2  0 0 0 0 0 0 2      Review of Systems  Constitutional: Negative for fever, chills, diaphoresis and fatigue.  HENT: Negative for congestion, rhinorrhea and sneezing.   Eyes: Negative.   Respiratory: Negative for cough, chest tightness and shortness of breath.   Cardiovascular: Negative for chest pain and leg swelling.  Gastrointestinal: Negative for nausea, vomiting, abdominal pain, diarrhea and blood in stool.  Genitourinary: Negative for frequency, hematuria, flank pain and difficulty urinating.  Musculoskeletal: Positive for arthralgias. Negative for back pain.  Skin: Negative for rash.  Neurological: Positive for numbness. Negative for dizziness, speech difficulty, weakness and headaches.    Allergies  Phenergan  Home Medications   Current Outpatient Rx  Name  Route  Sig  Dispense  Refill  . ALPRAZolam (XANAX) 0.25 MG tablet   Oral   Take 0.25 mg by mouth 3 (three) times daily as needed for sleep.         Marland Kitchen amphetamine-dextroamphetamine (ADDERALL) 20 MG tablet   Oral   Take 20 mg by mouth 3 (three) times daily.         . Loratadine (CLARITIN PO)   Oral   Take 1 tablet by mouth daily. Patient used medication for allergies.          Marland Kitchen  Ranitidine HCl (ZANTAC PO)   Oral   Take 1 tablet by mouth daily as needed. Patient used medication for heartburn.         Marland Kitchen albuterol (PROVENTIL HFA;VENTOLIN HFA) 108 (90 BASE) MCG/ACT inhaler   Inhalation   Inhale 1 puff into the lungs every 6 (six) hours as needed.           Marland Kitchen HYDROcodone-acetaminophen (NORCO) 5-325 MG per tablet   Oral   Take 1 tablet by mouth every 6 (six) hours as needed.           Marland Kitchen HYDROcodone-acetaminophen (NORCO/VICODIN) 5-325 MG per tablet   Oral   Take 2 tablets by mouth every 4 (four) hours as needed for pain.   15 tablet   0   . ibuprofen (ADVIL,MOTRIN) 600 MG tablet   Oral   Take 1 tablet (600 mg total) by mouth every 6  (six) hours as needed for pain.   30 tablet   0   . ondansetron (ZOFRAN) 4 MG tablet   Oral   Take 4 mg by mouth every 8 (eight) hours as needed.           . Prenatal Vit-Fe Psac Cmplx-FA (PRENATAL MULTIVITAMIN) 60-1 MG tablet   Oral   Take 1 tablet by mouth daily with breakfast.           . prenatal vitamin w/FE, FA (NATACHEW) 29-1 MG CHEW   Oral   Chew 1 tablet by mouth daily with breakfast.   30 tablet   1     BP 123/84  Pulse 99  Temp(Src) 98.2 F (36.8 C) (Oral)  Resp 16  Ht 5\' 2"  (1.575 m)  Wt 110 lb (49.896 kg)  BMI 20.11 kg/m2  SpO2 97%  LMP 02/07/2013  Physical Exam  Constitutional: She is oriented to person, place, and time. She appears well-developed and well-nourished.  HENT:  Head: Normocephalic and atraumatic.  Eyes: Pupils are equal, round, and reactive to light.  Neck: Normal range of motion. Neck supple.  Cardiovascular: Normal rate, regular rhythm and normal heart sounds.   Pulmonary/Chest: Effort normal and breath sounds normal. No respiratory distress. She has no wheezes. She has no rales. She exhibits no tenderness.  Abdominal: Soft. Bowel sounds are normal. There is no tenderness. There is no rebound and no guarding.  Musculoskeletal: Normal range of motion. She exhibits no edema.  Patient has normal coloration of both hands. She has normal warmth of both hands. She has tenderness on movement of the wrist in the fingers. Her pain is primarily on the lower surface of the hands. She has pain on palpation of the wrist as well as the volar surface of the hand. There is no injuries noted there is no swelling. She has normal sensation to light touch distally. Capillary refill is less than 2 distally. Radial pulses are intact. There is tenderness over the median nerve but it's not specifically tender in that area.  Lymphadenopathy:    She has no cervical adenopathy.  Neurological: She is alert and oriented to person, place, and time.  Skin: Skin is warm  and dry. No rash noted.  Psychiatric: She has a normal mood and affect.    ED Course  Procedures (including critical care time)  Results for orders placed during the hospital encounter of 02/28/13  GLUCOSE, CAPILLARY      Result Value Range   Glucose-Capillary 93  70 - 99 mg/dL  CBC WITH DIFFERENTIAL  Result Value Range   WBC 7.1  4.0 - 10.5 K/uL   RBC 4.15  3.87 - 5.11 MIL/uL   Hemoglobin 13.0  12.0 - 15.0 g/dL   HCT 40.9  81.1 - 91.4 %   MCV 92.0  78.0 - 100.0 fL   MCH 31.3  26.0 - 34.0 pg   MCHC 34.0  30.0 - 36.0 g/dL   RDW 78.2  95.6 - 21.3 %   Platelets 202  150 - 400 K/uL   Neutrophils Relative 51  43 - 77 %   Neutro Abs 3.6  1.7 - 7.7 K/uL   Lymphocytes Relative 35  12 - 46 %   Lymphs Abs 2.5  0.7 - 4.0 K/uL   Monocytes Relative 8  3 - 12 %   Monocytes Absolute 0.6  0.1 - 1.0 K/uL   Eosinophils Relative 4  0 - 5 %   Eosinophils Absolute 0.3  0.0 - 0.7 K/uL   Basophils Relative 1  0 - 1 %   Basophils Absolute 0.1  0.0 - 0.1 K/uL  BASIC METABOLIC PANEL      Result Value Range   Sodium 140  135 - 145 mEq/L   Potassium 3.5  3.5 - 5.1 mEq/L   Chloride 106  96 - 112 mEq/L   CO2 26  19 - 32 mEq/L   Glucose, Bld 97  70 - 99 mg/dL   BUN 8  6 - 23 mg/dL   Creatinine, Ser 0.86  0.50 - 1.10 mg/dL   Calcium 8.5  8.4 - 57.8 mg/dL   GFR calc non Af Amer >90  >90 mL/min   GFR calc Af Amer >90  >90 mL/min   No results found.    1. Neuropathy       MDM  I feel the patient likely has a neuropathy in her hands. There is new suggestion of Raynaud's. Her pain seems to be radiating from the wrist and could represent I nerve impingement however it doesn't follow the typical median nerve distribution. I will go ahead and place her in wrist splints. I gave her prescription for ibuprofen and Vicodin. I give her referral to follow up with hand surgeon.        Rolan Bucco, MD 02/28/13 2004

## 2014-08-25 ENCOUNTER — Encounter (HOSPITAL_BASED_OUTPATIENT_CLINIC_OR_DEPARTMENT_OTHER): Payer: Self-pay | Admitting: *Deleted

## 2022-02-23 ENCOUNTER — Encounter (HOSPITAL_BASED_OUTPATIENT_CLINIC_OR_DEPARTMENT_OTHER): Payer: Self-pay

## 2022-02-23 ENCOUNTER — Emergency Department (HOSPITAL_BASED_OUTPATIENT_CLINIC_OR_DEPARTMENT_OTHER)
Admission: EM | Admit: 2022-02-23 | Discharge: 2022-02-23 | Disposition: A | Discharge status: Home / Self Care | Payer: Medicaid Other | Attending: Emergency Medicine | Admitting: Emergency Medicine

## 2022-02-23 ENCOUNTER — Other Ambulatory Visit: Payer: Self-pay

## 2022-02-23 ENCOUNTER — Emergency Department (HOSPITAL_BASED_OUTPATIENT_CLINIC_OR_DEPARTMENT_OTHER): Payer: Medicaid Other

## 2022-02-23 DIAGNOSIS — R000 Tachycardia, unspecified: Secondary | ICD-10-CM | POA: Insufficient documentation

## 2022-02-23 DIAGNOSIS — N200 Calculus of kidney: Secondary | ICD-10-CM | POA: Insufficient documentation

## 2022-02-23 DIAGNOSIS — D72829 Elevated white blood cell count, unspecified: Secondary | ICD-10-CM | POA: Diagnosis not present

## 2022-02-23 DIAGNOSIS — E876 Hypokalemia: Secondary | ICD-10-CM | POA: Diagnosis not present

## 2022-02-23 DIAGNOSIS — N130 Hydronephrosis with ureteropelvic junction obstruction: Secondary | ICD-10-CM | POA: Insufficient documentation

## 2022-02-23 DIAGNOSIS — A599 Trichomoniasis, unspecified: Secondary | ICD-10-CM | POA: Insufficient documentation

## 2022-02-23 DIAGNOSIS — Q6211 Congenital occlusion of ureteropelvic junction: Secondary | ICD-10-CM

## 2022-02-23 DIAGNOSIS — R109 Unspecified abdominal pain: Secondary | ICD-10-CM | POA: Diagnosis present

## 2022-02-23 HISTORY — DX: Insomnia, unspecified: G47.00

## 2022-02-23 HISTORY — DX: Other intervertebral disc degeneration, lumbar region: M51.36

## 2022-02-23 LAB — URINALYSIS, ROUTINE W REFLEX MICROSCOPIC
Bilirubin Urine: NEGATIVE
Glucose, UA: NEGATIVE mg/dL
Ketones, ur: NEGATIVE mg/dL
Nitrite: NEGATIVE
Protein, ur: NEGATIVE mg/dL
Specific Gravity, Urine: 1.025 (ref 1.005–1.030)
pH: 6 (ref 5.0–8.0)

## 2022-02-23 LAB — CBC WITH DIFFERENTIAL/PLATELET
Abs Immature Granulocytes: 0.02 10*3/uL (ref 0.00–0.07)
Basophils Absolute: 0 10*3/uL (ref 0.0–0.1)
Basophils Relative: 0 %
Eosinophils Absolute: 0.1 10*3/uL (ref 0.0–0.5)
Eosinophils Relative: 1 %
HCT: 40.8 % (ref 36.0–46.0)
Hemoglobin: 13.4 g/dL (ref 12.0–15.0)
Immature Granulocytes: 0 %
Lymphocytes Relative: 19 %
Lymphs Abs: 2.1 10*3/uL (ref 0.7–4.0)
MCH: 28.9 pg (ref 26.0–34.0)
MCHC: 32.8 g/dL (ref 30.0–36.0)
MCV: 88.1 fL (ref 80.0–100.0)
Monocytes Absolute: 0.6 10*3/uL (ref 0.1–1.0)
Monocytes Relative: 5 %
Neutro Abs: 8.1 10*3/uL — ABNORMAL HIGH (ref 1.7–7.7)
Neutrophils Relative %: 75 %
Platelets: 277 10*3/uL (ref 150–400)
RBC: 4.63 MIL/uL (ref 3.87–5.11)
RDW: 14.1 % (ref 11.5–15.5)
WBC: 11 10*3/uL — ABNORMAL HIGH (ref 4.0–10.5)
nRBC: 0 % (ref 0.0–0.2)

## 2022-02-23 LAB — COMPREHENSIVE METABOLIC PANEL
ALT: 16 U/L (ref 0–44)
AST: 15 U/L (ref 15–41)
Albumin: 3.3 g/dL — ABNORMAL LOW (ref 3.5–5.0)
Alkaline Phosphatase: 103 U/L (ref 38–126)
Anion gap: 8 (ref 5–15)
BUN: 9 mg/dL (ref 6–20)
CO2: 24 mmol/L (ref 22–32)
Calcium: 8.8 mg/dL — ABNORMAL LOW (ref 8.9–10.3)
Chloride: 106 mmol/L (ref 98–111)
Creatinine, Ser: 1.09 mg/dL — ABNORMAL HIGH (ref 0.44–1.00)
GFR, Estimated: >60 mL/min (ref >60)
Glucose, Bld: 114 mg/dL — ABNORMAL HIGH (ref 70–99)
Potassium: 3.1 mmol/L — ABNORMAL LOW (ref 3.5–5.1)
Sodium: 138 mmol/L (ref 135–145)
Total Bilirubin: 0.4 mg/dL (ref 0.3–1.2)
Total Protein: 6.7 g/dL (ref 6.5–8.1)

## 2022-02-23 LAB — URINALYSIS, MICROSCOPIC (REFLEX)

## 2022-02-23 LAB — PREGNANCY, URINE: Preg Test, Ur: NEGATIVE

## 2022-02-23 MED ORDER — TAMSULOSIN HCL 0.4 MG PO CAPS: 0.4000 mg | ORAL_CAPSULE | Freq: Every day | ORAL | 0 refills | Status: AC

## 2022-02-23 MED ORDER — ONDANSETRON HCL 4 MG PO TABS: 4.0000 mg | ORAL_TABLET | Freq: Four times a day (QID) | ORAL | 0 refills | Status: AC

## 2022-02-23 MED ORDER — METRONIDAZOLE 500 MG PO TABS: 500.0000 mg | ORAL_TABLET | Freq: Two times a day (BID) | ORAL | 0 refills | Status: AC

## 2022-02-23 MED ORDER — CEPHALEXIN 500 MG PO CAPS: 500.0000 mg | ORAL_CAPSULE | Freq: Four times a day (QID) | ORAL | 0 refills | Status: AC

## 2022-02-23 MED ORDER — ONDANSETRON HCL 4 MG/2ML IJ SOLN
4.0000 mg | Freq: Once | INTRAMUSCULAR | Status: AC
  Administered 2022-02-23: 4 mg via INTRAVENOUS
  Filled 2022-02-23: qty 2

## 2022-02-23 MED ORDER — SODIUM CHLORIDE 0.9 % IV BOLUS
1000.0000 mL | Freq: Once | INTRAVENOUS | Status: AC
  Administered 2022-02-23: 1000 mL via INTRAVENOUS

## 2022-02-23 MED ORDER — KETOROLAC TROMETHAMINE 30 MG/ML IJ SOLN
15.0000 mg | Freq: Once | INTRAMUSCULAR | Status: AC
  Administered 2022-02-23: 15 mg via INTRAVENOUS
  Filled 2022-02-23: qty 1

## 2022-02-23 MED ORDER — KETOROLAC TROMETHAMINE 10 MG PO TABS: 10.0000 mg | ORAL_TABLET | Freq: Four times a day (QID) | ORAL | 0 refills | Status: AC | PRN

## 2022-02-23 NOTE — ED Notes (Signed)
Called lab @ 1516 to add on urine culture.  ?

## 2022-02-23 NOTE — Discharge Instructions (Signed)
You were seen in the emergency department today for pain in your right back.  You do have a kidney stone and some swelling of the tubing between your bladder and your kidney as well as of your kidney.  We are placing you on an antibiotic that you will take 4 times a day over the next 5 days.  Additionally I am giving a medication called Flomax that helps to relax the ureters.  Additionally I am giving you nausea medication called Zofran if you need this.  Also giving you Toradol for pain.  The urologist has recommended that if you begin to have fever with these pain symptoms to present to [facility] emergency department so he can be evaluated by urology there.  Otherwise I have placed a referral to urology for routine follow-up. ?

## 2022-02-23 NOTE — ED Provider Notes (Signed)
?[facility] EMERGENCY DEPARTMENT ?Provider Note ? ? ?CSN: [id number] ?Arrival date & time: 02/23/22  1430 ? ?  ? ?History ? ?Chief Complaint  ?Patient presents with  ? Flank Pain  ? ? ?Deborah [name] is a 46 y.o. female.  With past medical history of GERD who presents to the emergency department with right-sided flank pain. ? ?Patient states that yesterday evening she felt like she was having a difficult time peeing when she got home after dinner.  She states that later in the evening she began having right-sided back pain that she describes as constant and sharp.  She states that she was unable to pee until 3 AM this morning.  She states that she was having significant pain but denies hematuria or dysuria.  She has had associated nausea, diaphoresis, fever of 100 at home.  She denies any abdominal pain, vomiting, diarrhea, vaginal discharge. ? ? ?Flank Pain ? ? ?  ? ?Home Medications ?Prior to Admission medications   ?Medication Sig Start Date End Date Taking? Authorizing Provider  ?albuterol (PROVENTIL HFA;VENTOLIN HFA) 108 (90 BASE) MCG/ACT inhaler Inhale 1 puff into the lungs every 6 (six) hours as needed.      [provider]  ?ALPRAZolam Duanne Moron) 0.25 MG tablet Take 0.25 mg by mouth 3 (three) times daily as needed for sleep.    [provider]  ?amphetamine-dextroamphetamine (ADDERALL) 20 MG tablet Take 20 mg by mouth 3 (three) times daily.    [provider]  ?HYDROcodone-acetaminophen (NORCO) 5-325 MG per tablet Take 1 tablet by mouth every 6 (six) hours as needed.      [provider]  ?HYDROcodone-acetaminophen (NORCO/VICODIN) 5-325 MG per tablet Take 2 tablets by mouth every 4 (four) hours as needed for pain. 02/28/13   Malvin Johns, MD  ?ibuprofen (ADVIL,MOTRIN) 600 MG tablet Take 1 tablet (600 mg total) by mouth every 6 (six) hours as needed for pain. 02/28/13   Malvin Johns, MD  ?Loratadine (CLARITIN PO) Take 1 tablet by mouth daily. Patient used medication for  allergies.     [provider]  ?ondansetron (ZOFRAN) 4 MG tablet Take 4 mg by mouth every 8 (eight) hours as needed.      [provider]  ?Prenatal Vit-Fe Psac Cmplx-FA (PRENATAL MULTIVITAMIN) 60-1 MG tablet Take 1 tablet by mouth daily with breakfast.      [provider]  ?prenatal vitamin w/FE, FA (NATACHEW) 29-1 MG CHEW Chew 1 tablet by mouth daily with breakfast. 05/26/11   Bovard-Stuckert, Jody, MD  ?Ranitidine HCl (ZANTAC PO) Take 1 tablet by mouth daily as needed. Patient used medication for heartburn.    [provider]  ?   ? ?Allergies    ?Phenergan [promethazine hcl], Morphine, and Codeine   ? ?Review of Systems   ?Review of Systems  ?Constitutional:  Positive for chills and fever.  ?Gastrointestinal:  Positive for nausea.  ?Genitourinary:  Positive for decreased urine volume, difficulty urinating and flank pain. Negative for dysuria, hematuria and vaginal discharge.  ?All other systems reviewed and are negative. ? ?Physical Exam ?Updated Vital Signs ?BP (!) 151/105 (BP Location: Left Arm)   Pulse (!) 113   Temp 98 ?F (36.7 ?C) (Oral)   Resp 18   Ht '5\' 2"'$  (1.575 m)   Wt 71.7 kg   LMP 02/08/2022   SpO2 97%   BMI 28.90 kg/m?  ?Physical Exam ?Vitals and nursing note reviewed.  ?Constitutional:   ?   General: She is not in  acute distress. ?   Appearance: Normal appearance. She is normal weight. She is ill-appearing. She is not toxic-appearing.  ?HENT:  ?   Head: Normocephalic and atraumatic.  ?Eyes:  ?   General: No scleral icterus. ?   Extraocular Movements: Extraocular movements intact.  ?Cardiovascular:  ?   Rate and Rhythm: Regular rhythm. Tachycardia present.  ?   Pulses: Normal pulses.  ?Pulmonary:  ?   Effort: Pulmonary effort is normal. No respiratory distress.  ?Abdominal:  ?   General: Bowel sounds are normal. There is no distension.  ?   Palpations: Abdomen is soft.  ?   Tenderness: There is abdominal tenderness in the right lower quadrant. There is  right CVA tenderness. There is no left CVA tenderness. Negative signs include Rovsing's sign.  ?Musculoskeletal:     ?   General: Normal range of motion.  ?   Cervical back: Neck supple.  ?Skin: ?   General: Skin is warm and dry.  ?   Capillary Refill: Capillary refill takes less than 2 seconds.  ?Neurological:  ?   General: No focal deficit present.  ?   Mental Status: She is alert and oriented to person, place, and time. Mental status is at baseline.  ?Psychiatric:     ?   Mood and Affect: Mood normal.     ?   Behavior: Behavior normal.     ?   Thought Content: Thought content normal.     ?   Judgment: Judgment normal.  ? ? ?ED Results / Procedures / Treatments   ?Labs ?(all labs ordered are listed, but only abnormal results are displayed) ?Labs Reviewed  ?URINALYSIS, ROUTINE W REFLEX MICROSCOPIC - Abnormal; Notable for the following components:  ?    Result Value  ? Hgb urine dipstick SMALL (*)   ? Leukocytes,Ua TRACE (*)   ? All other components within normal limits  ?COMPREHENSIVE METABOLIC PANEL - Abnormal; Notable for the following components:  ? Potassium 3.1 (*)   ? Glucose, Bld 114 (*)   ? Creatinine, Ser 1.09 (*)   ? Calcium 8.8 (*)   ? Albumin 3.3 (*)   ? All other components within normal limits  ?CBC WITH DIFFERENTIAL/PLATELET - Abnormal; Notable for the following components:  ? WBC 11.0 (*)   ? Neutro Abs 8.1 (*)   ? All other components within normal limits  ?URINALYSIS, MICROSCOPIC (REFLEX) - Abnormal; Notable for the following components:  ? Bacteria, UA FEW (*)   ? Trichomonas, UA PRESENT (*)   ? All other components within normal limits  ?URINE CULTURE  ?PREGNANCY, URINE  ? ?EKG ?None ? ?Radiology ?CT Renal Stone Study ? ?Result Date: 02/23/2022 ?CLINICAL DATA:  Right flank pain and dysuria x2 days EXAM: CT ABDOMEN AND PELVIS WITHOUT CONTRAST TECHNIQUE: Multidetector CT imaging of the abdomen and pelvis was performed following the standard protocol without IV contrast. RADIATION DOSE REDUCTION:  This exam was performed according to the departmental dose-optimization program which includes automated exposure control, adjustment of the mA and/or kV according to patient size and/or use of iterative reconstruction technique. COMPARISON:  Abdominal ultrasound February 04, 2011 FINDINGS: Lower chest: No acute abnormality. Hepatobiliary: Hypodense nodular focus measuring 15 mm in segment IV along the falciform ligament on image 27/2 is incompletely characterized on this noncontrast examination but in region commonly associated with focal fatty infiltration or altered perfusion. Pancreas: No pancreatic ductal dilation or evidence of acute inflammation. Spleen: No splenomegaly or focal splenic lesion. Adrenals/Urinary Tract:  Bilateral adrenal glands appear normal. There is right-sided hydroureteronephrosis to the level of the urinary bladder with a 1 mm calcification at the UVJ on image 78/2. There are few additional calcifications along the course of the distal ureter however these appear to be outside of the ureter and are favored to reflect phleboliths. There is perinephric fluid/stranding on the right with edematous appearance of the right kidney. Left kidney is unremarkable without hydronephrosis or nephrolithiasis. Urinary bladder is nondistended limiting evaluation. Stomach/Bowel: No radiopaque enteric contrast material was administered. Stomach is unremarkable for degree of distension. Inflammatory stranding along the second and third part of the duodenum which is favored relapse reactive related to the right renal process. No pathologic dilation of small or large bowel. The appendix and terminal ileum appear normal. Vascular/Lymphatic: Normal caliber abdominal aorta. No pathologically enlarged abdominal or pelvic lymph nodes. Reproductive: Uterus and bilateral adnexa are unremarkable. Other: No walled off fluid collections.  No pneumoperitoneum. Musculoskeletal: L5-S1 discogenic disease. No acute osseous  abnormality. IMPRESSION: 1. Right-sided hydroureteronephrosis to the level of the urinary bladder with a 1 mm calcification at the UVJ. 2. Marked right perinephric and periureteric free fluid and stranding which may refl

## 2022-02-23 NOTE — ED Triage Notes (Signed)
Pt c/o R flank pain and difficulty with urination x 2 days.  ?

## 2022-02-25 LAB — URINE CULTURE

## 2022-10-19 IMAGING — CT CT RENAL STONE PROTOCOL
2 of 4 series · 15 of 46 positions shown, 17 images · non-contrast
Comparison: Abdominal ultrasound February 04, 2011

CLINICAL DATA: Right flank pain and dysuria x2 days



[Series 2: axial st · axial · 0.97mm/px · z∈[-448,-38]mm · 12 of 94 slices shown, 14 images]
[im 8/94  soft-tissue]
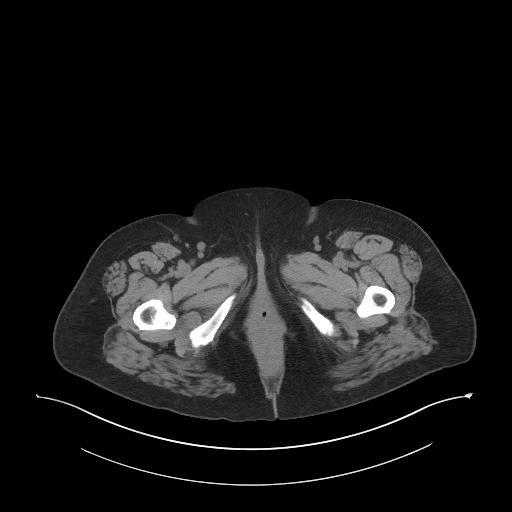
[im 8/94  bone]
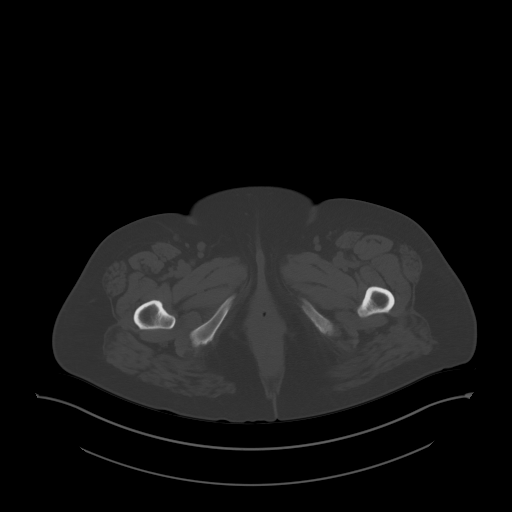
[im 15/94  soft-tissue]
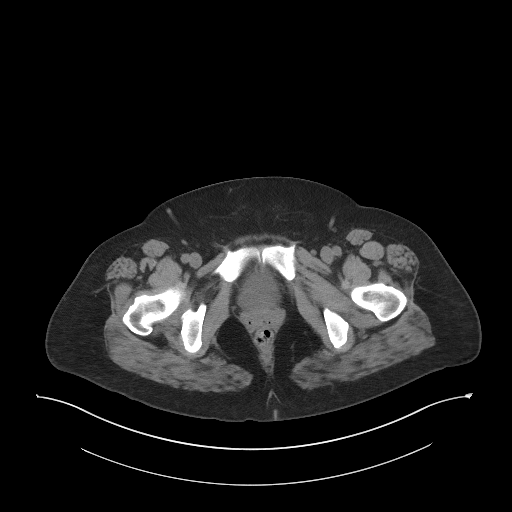
[im 23/94  soft-tissue]
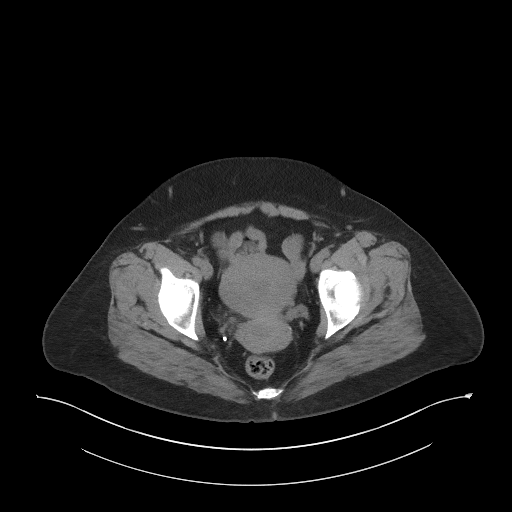
[im 30/94  soft-tissue]
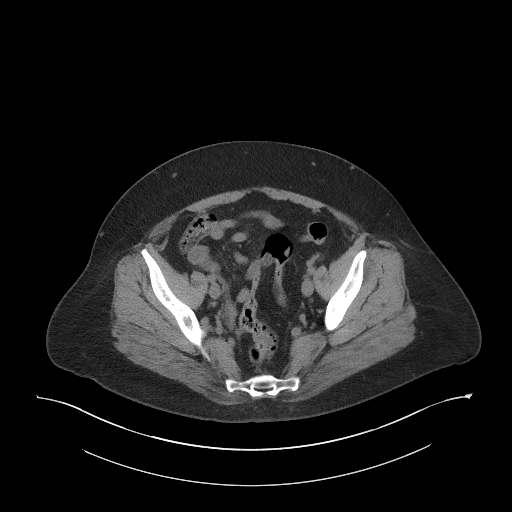
[im 38/94  soft-tissue]
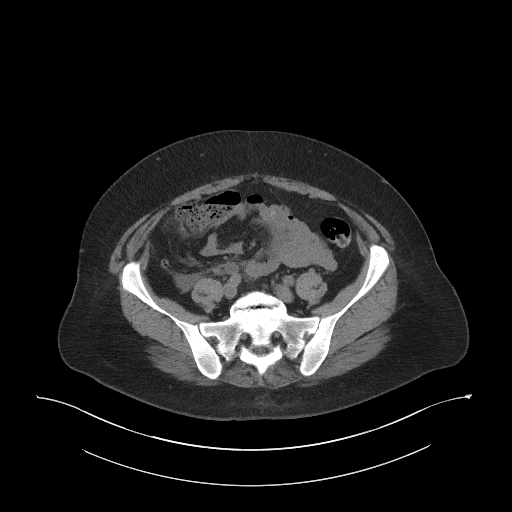
[im 45/94  soft-tissue]
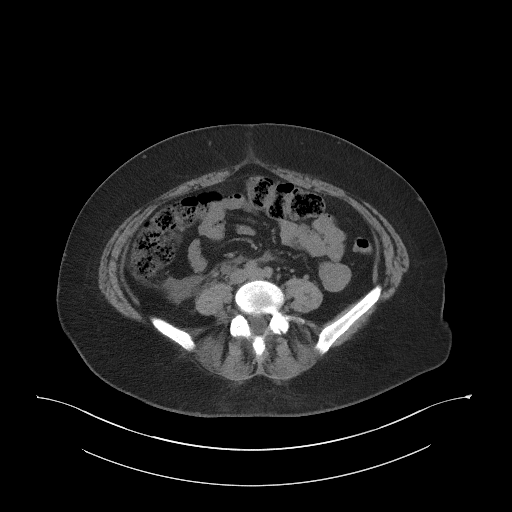
[im 53/94  soft-tissue]
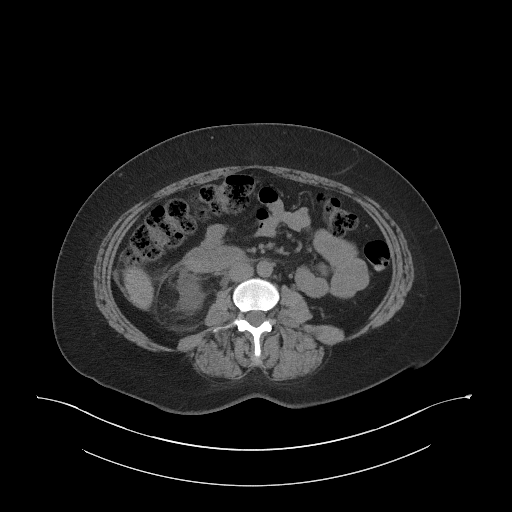
[im 60/94  soft-tissue]
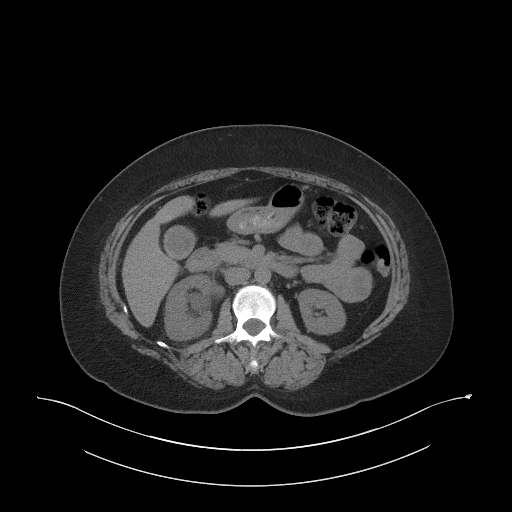
[im 67/94  soft-tissue]
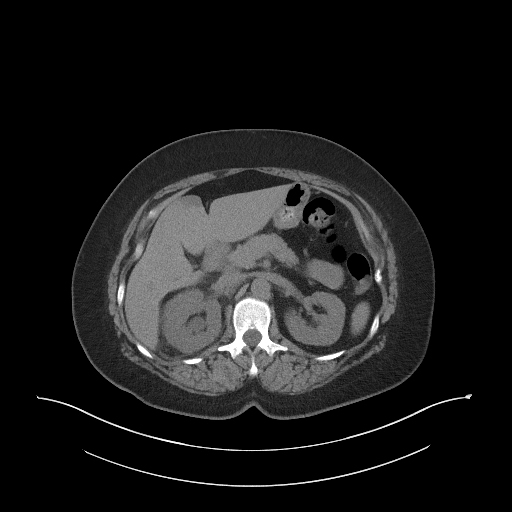
[im 67/94  bone]
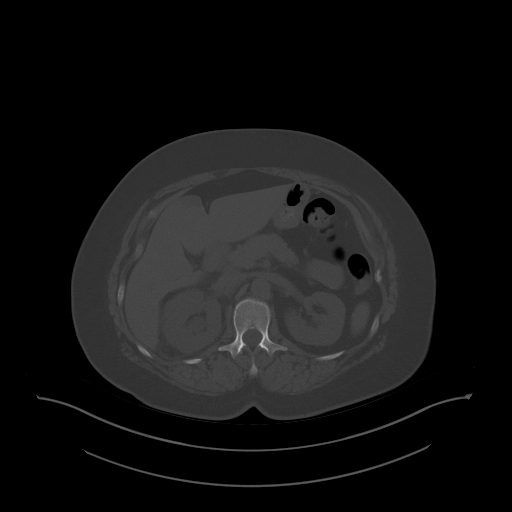
[im 75/94  soft-tissue]
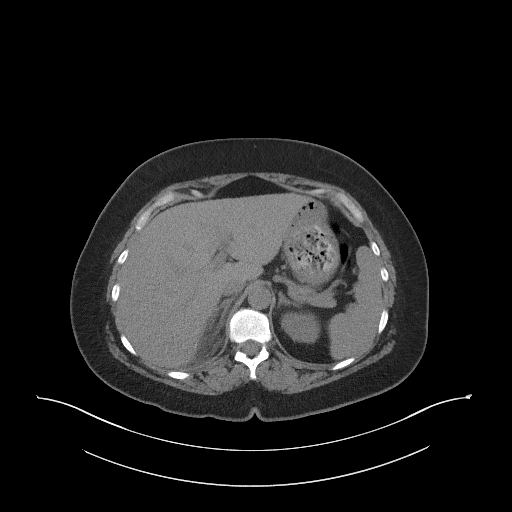
[im 82/94  soft-tissue]
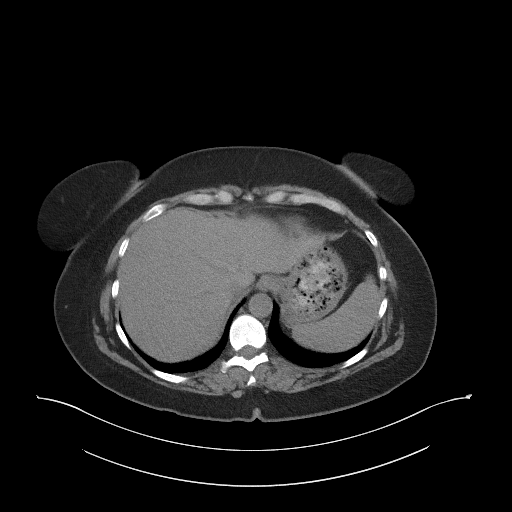
[im 90/94  soft-tissue]
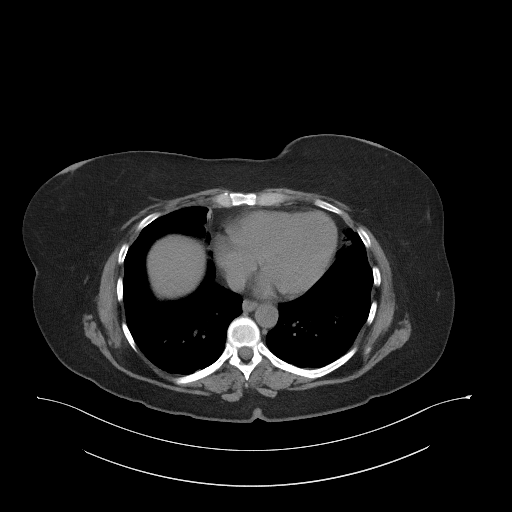

[Series 5: coronal st · coronal · 0.82mm/px · 3 of 97 slices shown]
[im 33/97  soft-tissue]
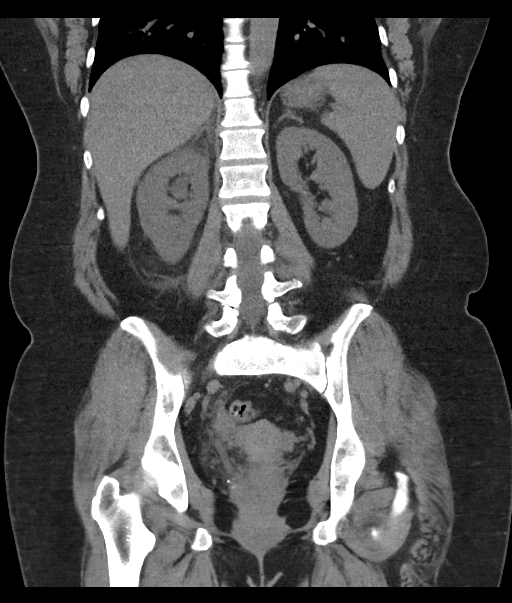
[im 43/97  soft-tissue]
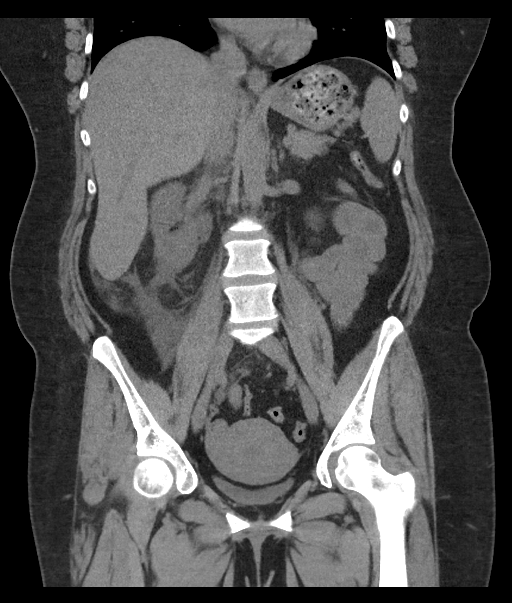
[im 54/97  soft-tissue]
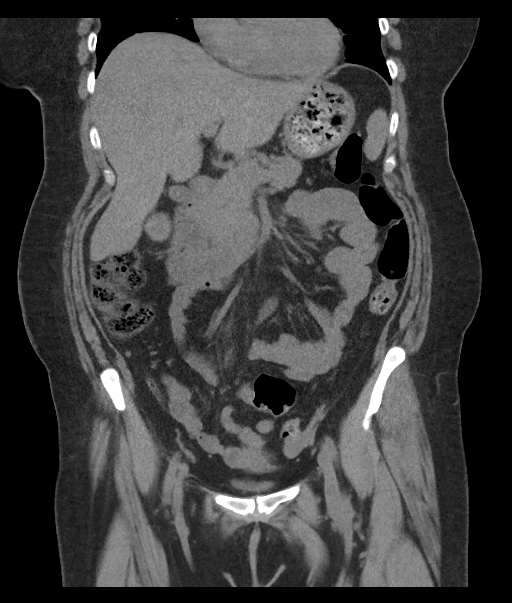

[15 of 46 positions shown; findings below may reference images not displayed]

FINDINGS: Lower chest: No acute abnormality.

Hepatobiliary: Hypodense nodular focus measuring 15 mm in segment IV
along the falciform ligament on image [DATE] is incompletely
characterized on this noncontrast examination but in region commonly
associated with focal fatty infiltration or altered perfusion.

Pancreas: No pancreatic ductal dilation or evidence of acute
inflammation.

Spleen: No splenomegaly or focal splenic lesion.

Adrenals/Urinary Tract: Bilateral adrenal glands appear normal.

There is right-sided hydroureteronephrosis to the level of the
urinary bladder with a 1 mm calcification at the UVJ on image 78/2.
There are few additional calcifications along the course of the
distal ureter however these appear to be outside of the ureter and
are favored to reflect phleboliths. There is perinephric
fluid/stranding on the right with edematous appearance of the right
kidney.

Left kidney is unremarkable without hydronephrosis or
nephrolithiasis.

Urinary bladder is nondistended limiting evaluation.

Stomach/Bowel: No radiopaque enteric contrast material was
administered. Stomach is unremarkable for degree of distension.
Inflammatory stranding along the second and third part of the
duodenum which is favored relapse reactive related to the right
renal process. No pathologic dilation of small or large bowel. The
appendix and terminal ileum appear normal.

Vascular/Lymphatic: Normal caliber abdominal aorta. No
pathologically enlarged abdominal or pelvic lymph nodes.

Reproductive: Uterus and bilateral adnexa are unremarkable.

Other: No walled off fluid collections.  No pneumoperitoneum.

Musculoskeletal: L5-S1 discogenic disease. No acute osseous
abnormality.
IMPRESSION: 1. Right-sided hydroureteronephrosis to the level of the urinary
bladder with a 1 mm calcification at the UVJ.
2. Marked right perinephric and periureteric free fluid and
stranding which may reflect superimposed infection.
3. Inflammatory stranding along the second and third part of the
duodenum which is favored relapse reactive related to the right
renal process.
4. Hypodense nodular focus measuring 15 mm in segment IV along the
falciform ligament is incompletely characterized on this noncontrast
examination but in region commonly associated with focal fatty
infiltration or altered perfusion. If clinically indicated this
could be more definitively characterized by abdominal hepatic
protocol MRI with and without contrast.
